# Patient Record
Sex: Female | Born: 1995 | Race: Black or African American | Hispanic: No | Marital: Single | Smoking: Never smoker
Health system: Southern US, Community
[De-identification: ages and names within clinical notes are randomized; demographics above are authoritative.]

## PROBLEM LIST (undated history)

## (undated) DIAGNOSIS — E119 Type 2 diabetes mellitus without complications: Secondary | ICD-10-CM

---
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
## 2013-06-03 ENCOUNTER — Emergency Department: Payer: Self-pay | Admitting: Emergency Medicine

## 2013-07-26 ENCOUNTER — Emergency Department: Payer: Self-pay | Admitting: Emergency Medicine

## 2013-07-26 LAB — URINALYSIS, COMPLETE
Bilirubin,UR: NEGATIVE
Blood: NEGATIVE
Glucose,UR: NEGATIVE mg/dL (ref 0–75)
Ketone: NEGATIVE
Nitrite: NEGATIVE
Ph: 5 (ref 4.5–8.0)
Protein: NEGATIVE
RBC,UR: 2 /HPF (ref 0–5)
SPECIFIC GRAVITY: 1.027 (ref 1.003–1.030)
Squamous Epithelial: 50
WBC UR: 5 /HPF (ref 0–5)

## 2013-07-26 LAB — CBC WITH DIFFERENTIAL/PLATELET
BASOS ABS: 0.1 10*3/uL (ref 0.0–0.1)
BASOS PCT: 1 %
EOS ABS: 0.2 10*3/uL (ref 0.0–0.7)
Eosinophil %: 3.1 %
HCT: 32 % — ABNORMAL LOW (ref 35.0–47.0)
HGB: 10.9 g/dL — ABNORMAL LOW (ref 12.0–16.0)
LYMPHS PCT: 21.7 %
Lymphocyte #: 1.2 10*3/uL (ref 1.0–3.6)
MCH: 28.8 pg (ref 26.0–34.0)
MCHC: 34 g/dL (ref 32.0–36.0)
MCV: 85 fL (ref 80–100)
MONO ABS: 0.6 x10 3/mm (ref 0.2–0.9)
Monocyte %: 10 %
NEUTROS ABS: 3.7 10*3/uL (ref 1.4–6.5)
NEUTROS PCT: 64.2 %
Platelet: 186 10*3/uL (ref 150–440)
RBC: 3.77 10*6/uL — ABNORMAL LOW (ref 3.80–5.20)
RDW: 16.9 % — ABNORMAL HIGH (ref 11.5–14.5)
WBC: 5.7 10*3/uL (ref 3.6–11.0)

## 2013-07-26 LAB — GC/CHLAMYDIA PROBE AMP

## 2013-07-26 LAB — COMPREHENSIVE METABOLIC PANEL
ALK PHOS: 101 U/L
Albumin: 3.3 g/dL — ABNORMAL LOW (ref 3.8–5.6)
Anion Gap: 6 — ABNORMAL LOW (ref 7–16)
BILIRUBIN TOTAL: 0.4 mg/dL (ref 0.2–1.0)
BUN: 11 mg/dL (ref 7–18)
CHLORIDE: 110 mmol/L — AB (ref 98–107)
CREATININE: 0.62 mg/dL (ref 0.60–1.30)
Calcium, Total: 8.1 mg/dL — ABNORMAL LOW (ref 9.0–10.7)
Co2: 21 mmol/L (ref 21–32)
EGFR (African American): >60
EGFR (Non-African Amer.): >60
GLUCOSE: 114 mg/dL — AB (ref 65–99)
Osmolality: 274 (ref 275–301)
POTASSIUM: 3.8 mmol/L (ref 3.5–5.1)
SGOT(AST): 18 U/L (ref 0–26)
SGPT (ALT): 11 U/L — ABNORMAL LOW (ref 12–78)
SODIUM: 137 mmol/L (ref 136–145)
Total Protein: 6.5 g/dL (ref 6.4–8.6)

## 2013-07-26 LAB — WET PREP, GENITAL

## 2013-07-26 LAB — LIPASE, BLOOD: LIPASE: 123 U/L (ref 73–393)

## 2013-07-26 LAB — HCG, QUANTITATIVE, PREGNANCY: Beta Hcg, Quant.: 40136 m[IU]/mL — ABNORMAL HIGH

## 2014-07-14 ENCOUNTER — Emergency Department: Payer: Self-pay | Admitting: Emergency Medicine

## 2018-02-24 ENCOUNTER — Encounter: Payer: Self-pay | Admitting: Emergency Medicine

## 2018-02-24 ENCOUNTER — Emergency Department
Admission: EM | Admit: 2018-02-24 | Discharge: 2018-02-24 | Disposition: A | Discharge status: Home / Self Care | Payer: Self-pay | Attending: Emergency Medicine | Admitting: Emergency Medicine

## 2018-02-24 DIAGNOSIS — Z5321 Procedure and treatment not carried out due to patient leaving prior to being seen by health care provider: Secondary | ICD-10-CM | POA: Insufficient documentation

## 2018-02-24 DIAGNOSIS — O3461 Maternal care for abnormality of vagina, first trimester: Secondary | ICD-10-CM | POA: Insufficient documentation

## 2018-02-24 DIAGNOSIS — Z3A01 Less than 8 weeks gestation of pregnancy: Secondary | ICD-10-CM | POA: Insufficient documentation

## 2018-02-24 LAB — URINALYSIS, ROUTINE W REFLEX MICROSCOPIC
Bacteria, UA: NONE SEEN
Bilirubin Urine: NEGATIVE
HGB URINE DIPSTICK: NEGATIVE
Ketones, ur: NEGATIVE mg/dL
NITRITE: NEGATIVE
PH: 6 (ref 5.0–8.0)
PROTEIN: NEGATIVE mg/dL
RBC / HPF: >50 RBC/hpf — ABNORMAL HIGH (ref 0–5)
SPECIFIC GRAVITY, URINE: 1.029 (ref 1.005–1.030)

## 2018-02-24 LAB — HCG, QUANTITATIVE, PREGNANCY: hCG, Beta Chain, Quant, S: 14938 m[IU]/mL — ABNORMAL HIGH (ref <5)

## 2018-02-24 NOTE — ED Notes (Signed)
Pt called x 2, no answer. RN walked around lobby and did not see pt

## 2018-02-24 NOTE — ED Notes (Signed)
Pt called,no answer.

## 2018-02-24 NOTE — ED Triage Notes (Signed)
Pt presents via POV c/o green and brown vaginal discharge. Pt reports + pregnancy test x1 week ago and reports apx 7 weeks OB. Denies pain.

## 2018-02-26 ENCOUNTER — Telehealth: Payer: Self-pay | Admitting: Emergency Medicine

## 2018-02-26 NOTE — Telephone Encounter (Signed)
Called patient due to lwot to inquire about condition and follow up plans.  No answer and no voicemail  

## 2018-04-17 ENCOUNTER — Emergency Department: Payer: Self-pay

## 2018-04-17 ENCOUNTER — Encounter: Payer: Self-pay | Admitting: Emergency Medicine

## 2018-04-17 ENCOUNTER — Emergency Department
Admission: EM | Admit: 2018-04-17 | Discharge: 2018-04-17 | Disposition: A | Discharge status: Home / Self Care | Payer: Self-pay | Attending: Emergency Medicine | Admitting: Emergency Medicine

## 2018-04-17 DIAGNOSIS — S60221A Contusion of right hand, initial encounter: Secondary | ICD-10-CM

## 2018-04-17 DIAGNOSIS — Y929 Unspecified place or not applicable: Secondary | ICD-10-CM | POA: Insufficient documentation

## 2018-04-17 DIAGNOSIS — W2209XA Striking against other stationary object, initial encounter: Secondary | ICD-10-CM | POA: Insufficient documentation

## 2018-04-17 DIAGNOSIS — Y999 Unspecified external cause status: Secondary | ICD-10-CM | POA: Insufficient documentation

## 2018-04-17 DIAGNOSIS — Y939 Activity, unspecified: Secondary | ICD-10-CM | POA: Insufficient documentation

## 2018-04-17 DIAGNOSIS — S6991XA Unspecified injury of right wrist, hand and finger(s), initial encounter: Secondary | ICD-10-CM | POA: Insufficient documentation

## 2018-04-17 LAB — GLUCOSE, CAPILLARY: GLUCOSE-CAPILLARY: 115 mg/dL — AB (ref 70–99)

## 2018-04-17 MED ORDER — MELOXICAM 15 MG PO TABS: 15.0000 mg | ORAL_TABLET | Freq: Every day | ORAL | 1 refills | Status: AC

## 2018-04-17 NOTE — ED Triage Notes (Signed)
Pt reports she slammed right hand down onto wooden surface this evening. Pt c/o right hand pain. No swelling or deformity noted.

## 2018-04-17 NOTE — ED Notes (Signed)
R radial pulse 2+; warm; appropriate color; pt states she can't move her right hand at all.

## 2018-04-17 NOTE — ED Provider Notes (Signed)
[facility] Emergency Department Provider Note  ____________________________________________  Time seen: Approximately 11:56 PM  I have reviewed the triage vital signs and the nursing notes.   HISTORY  Chief Complaint Hand Pain    HPI Gwyndolyn KaufmanQuanisha X Hawkins Mack is a 22 y.o. female presents to the emergency department with right hand pain after patient reports that she slammed her right hand onto a wooden surface earlier in the evening.  Patient currently rates her pain at 8 out of 10 in intensity and describes it as aching.  No numbness or tingling in the right hand.  No abrasions or lacerations.  Patient denies prior right hand issues in the past.  No alleviating measures have been attempted prior to presenting to the emergency department.    History reviewed. No pertinent past medical history.  There are no active problems to display for this patient.   History reviewed. No pertinent surgical history.  Prior to Admission medications   Medication Sig Start Date End Date Taking? Authorizing Provider  meloxicam (MOBIC) 15 MG tablet Take 1 tablet (15 mg total) by mouth daily for 7 days. 04/17/18 04/24/18  Orvil FeilWoods, Johnpaul Gillentine M, PA-C    Allergies Amoxicillin  History reviewed. No pertinent family history.  Social History Social History   Tobacco Use  . Smoking status: Never Smoker  . Smokeless tobacco: Never Used  Substance Use Topics  . Alcohol use: Not on file  . Drug use: Yes    Types: Marijuana     Review of Systems  Constitutional: No fever/chills Eyes: No visual changes. No discharge ENT: No upper respiratory complaints. Cardiovascular: no chest pain. Respiratory: no cough. No SOB. Gastrointestinal: No abdominal pain.  No nausea, no vomiting.  No diarrhea.  No constipation. Musculoskeletal: Patient has right hand pain.  Skin: Negative for rash, abrasions, lacerations, ecchymosis. Neurological: Negative for headaches, focal weakness or  numbness.   ____________________________________________   PHYSICAL EXAM:  VITAL SIGNS: ED Triage Vitals  Enc Vitals Group     BP 04/17/18 2156 118/68     Pulse Rate 04/17/18 2156 92     Resp 04/17/18 2156 16     Temp 04/17/18 2156 97.8 F (36.6 C)     Temp Source 04/17/18 2156 Oral     SpO2 04/17/18 2156 100 %     Weight --      Height --      Head Circumference --      Peak Flow --      Pain Score 04/17/18 2312 7     Pain Loc --      Pain Edu? --      Excl. in GC? --      Constitutional: Alert and oriented. Well appearing and in no acute distress. Eyes: Conjunctivae are normal. PERRL. EOMI. Head: Atraumatic. Cardiovascular: Normal rate, regular rhythm. Normal S1 and S2.  Good peripheral circulation. Respiratory: Normal respiratory effort without tachypnea or retractions. Lungs CTAB. Good air entry to the bases with no decreased or absent breath sounds. Musculoskeletal: No right hand swelling.  Patient is able to move all 5 right fingers.  Mild tenderness to palpation over the fourth and fifth metacarpals.  No pain with palpation of the anatomical snuffbox.  Full range of motion at the right wrist and right elbow.  Palpable radial pulse, right. Neurologic:  Normal speech and language. No gross focal neurologic deficits are appreciated.  Skin:  Skin is warm, dry and intact. No rash noted. Psychiatric: Mood and affect are  normal. Speech and behavior are normal. Patient exhibits appropriate insight and judgement.   ____________________________________________   LABS (all labs ordered are listed, but only abnormal results are displayed)  Labs Reviewed  GLUCOSE, CAPILLARY - Abnormal; Notable for the following components:      Result Value   Glucose-Capillary 115 (*)    All other components within normal limits   ____________________________________________  EKG   ____________________________________________  RADIOLOGY I personally viewed and evaluated these  images as part of my medical decision making, as well as reviewing the written report by the radiologist.  Dg Hand Complete Right  Result Date: 04/17/2018 CLINICAL DATA:  Right hand pain after injury, slammed hand down onto wooden surface this evening. EXAM: RIGHT HAND - COMPLETE 3+ VIEW COMPARISON:  None. FINDINGS: Evaluation of the digits partially obscured due to positioning, patient reported being unable to separate her fingers. There is no evidence of fracture or dislocation. There is no evidence of arthropathy or other focal bone abnormality. Soft tissues are unremarkable. IMPRESSION: Negative radiographs of the right hand allowing for limited assessment of the digits secondary to positioning. Electronically Signed   By: Narda Rutherford M.D.   On: 04/17/2018 22:20    ____________________________________________    PROCEDURES  Procedure(s) performed:    Procedures    Medications - No data to display   ____________________________________________   INITIAL IMPRESSION / ASSESSMENT AND PLAN / ED COURSE  Pertinent labs & imaging results that were available during my care of the patient were reviewed by me and considered in my medical decision making (see chart for details).  Review of the [place] CSRS was performed in accordance of the [facility] prior to dispensing any controlled drugs.      Assessment and plan Right hand pain Patient presents to the emergency department with right hand pain after patient reportedly slammed her hand on a wooden table.  X-ray examination reveals no acute bony abnormality.  Patient had some mild tenderness to palpation over the fourth and fifth metacarpals of the right hand. Physical exam was otherwise reassuring.  Meloxicam was recommended for pain after patient denied the possibility of pregnancy.  Vital signs were reassuring prior to discharge.    ____________________________________________  FINAL CLINICAL IMPRESSION(S) / ED DIAGNOSES  Final  diagnoses:  Contusion of right hand, initial encounter      NEW MEDICATIONS STARTED DURING THIS VISIT:  ED Discharge Orders         Ordered    meloxicam (MOBIC) 15 MG tablet  Daily     04/17/18 2301              This chart was dictated using voice recognition software/Dragon. Despite best efforts to proofread, errors can occur which can change the meaning. Any change was purely unintentional.    Orvil Feil, PA-C 04/17/18 2359    Minna Antis, MD 04/20/18 [phone]

## 2019-08-06 IMAGING — DX DG HAND COMPLETE 3+V*R*
3 series · 3 of 3 positions shown · non-contrast
Comparison: None.

CLINICAL DATA: Right hand pain after injury, slammed hand down onto
[place] surface this evening.

EXAM:
RIGHT HAND - COMPLETE 3+ VIEW

[hand ap]
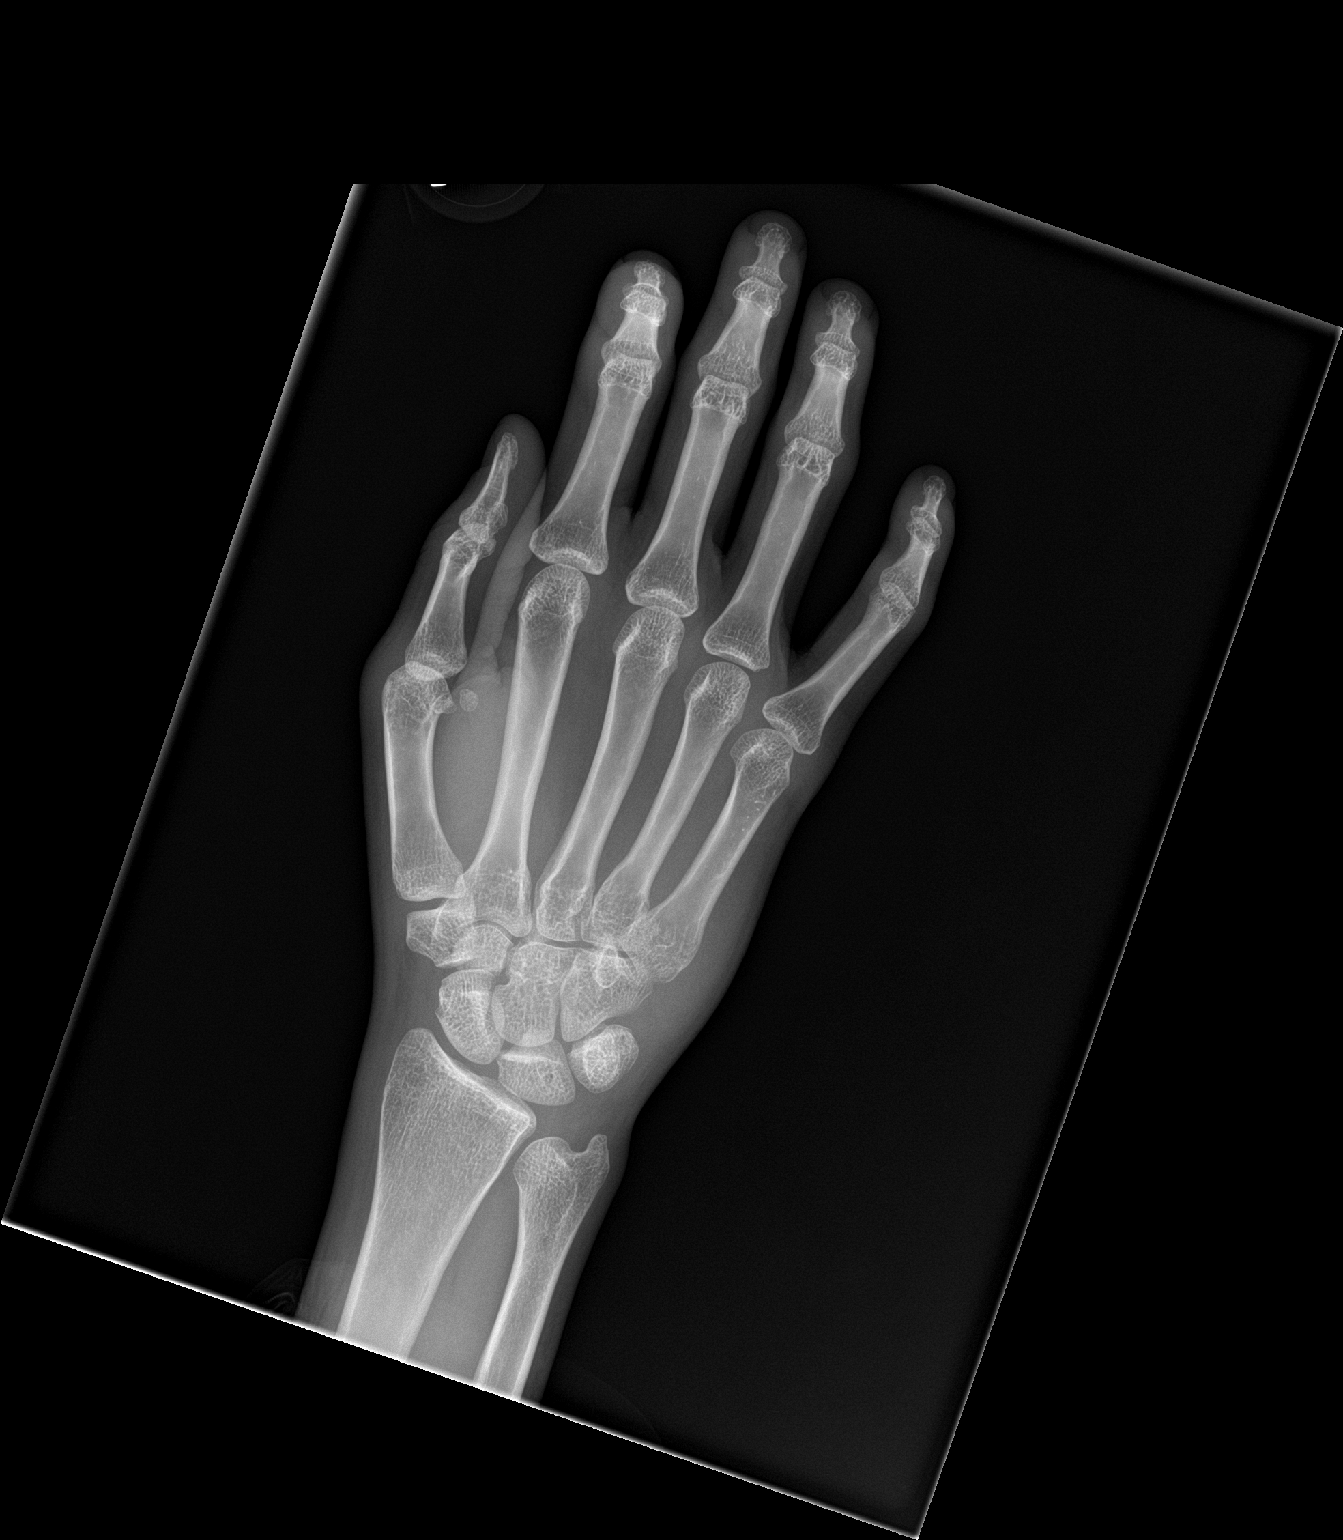

[hand obl]
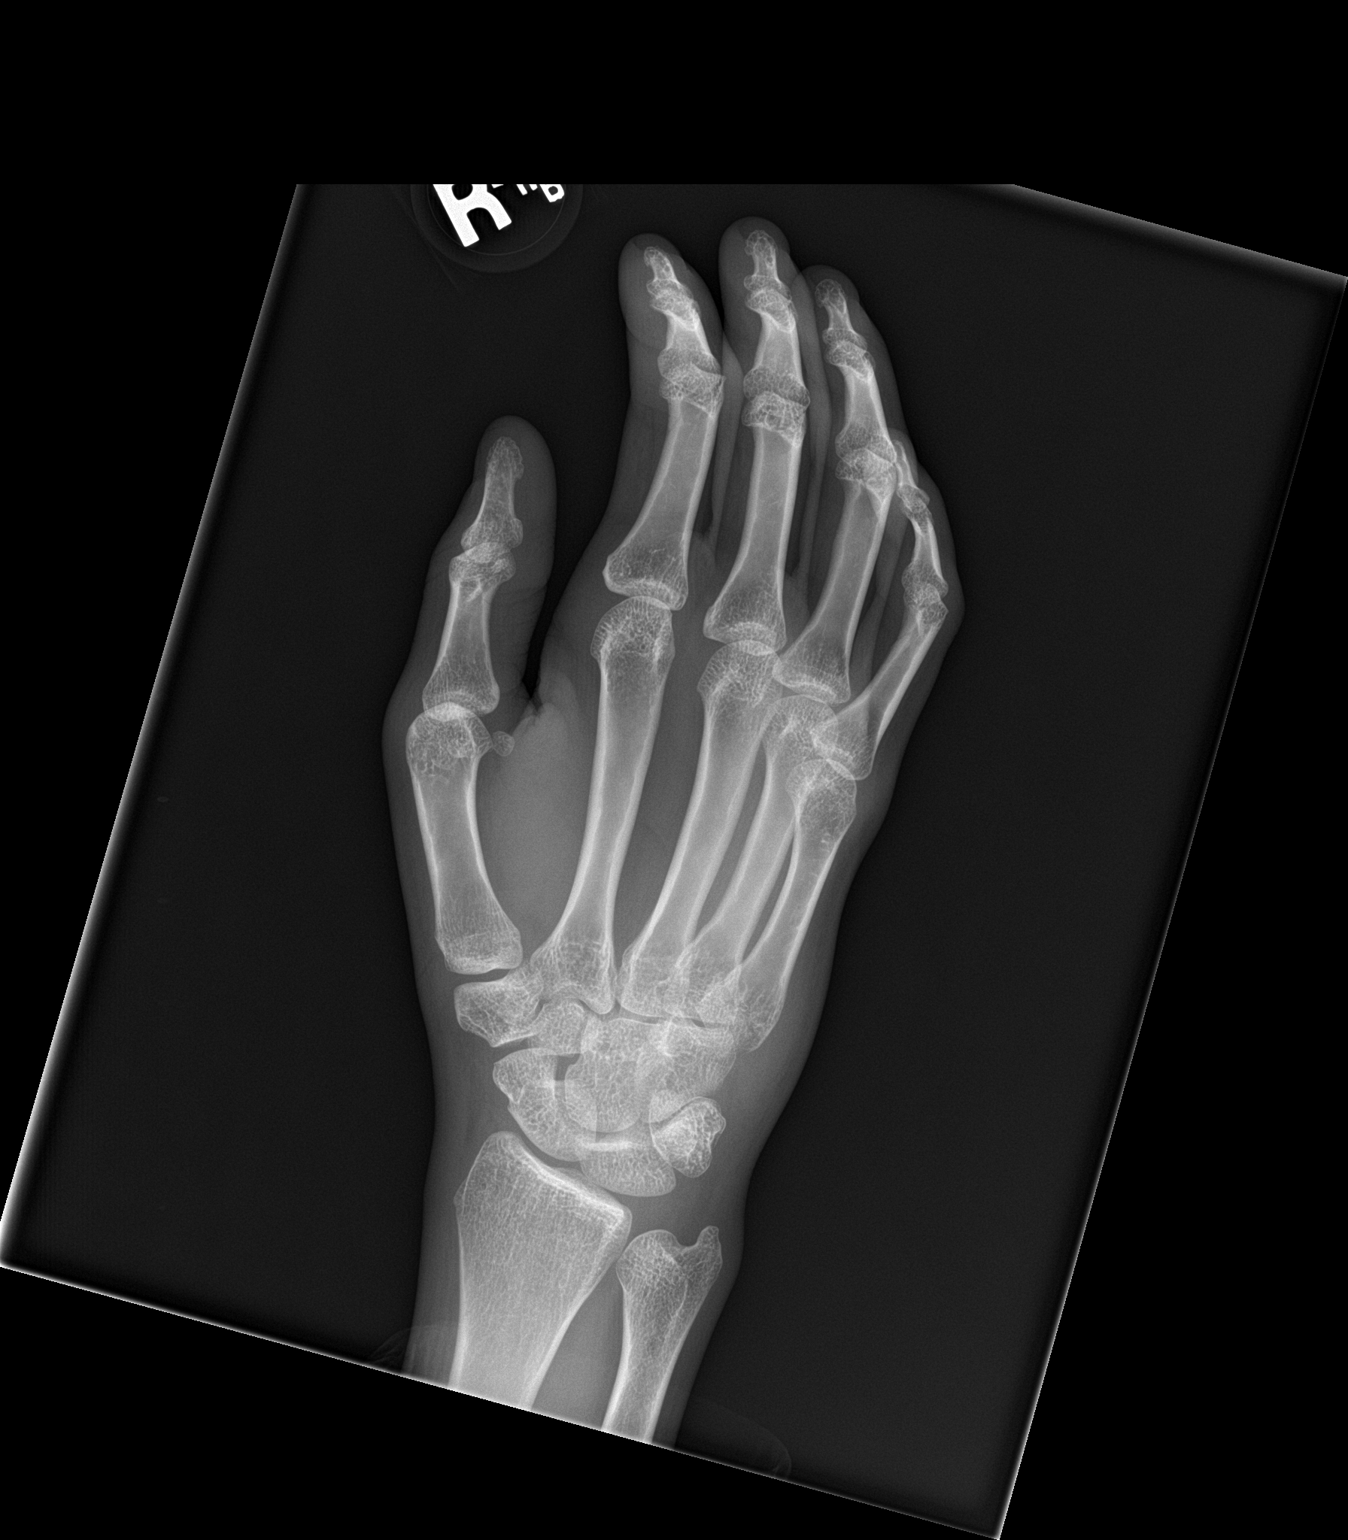

[hand lat]
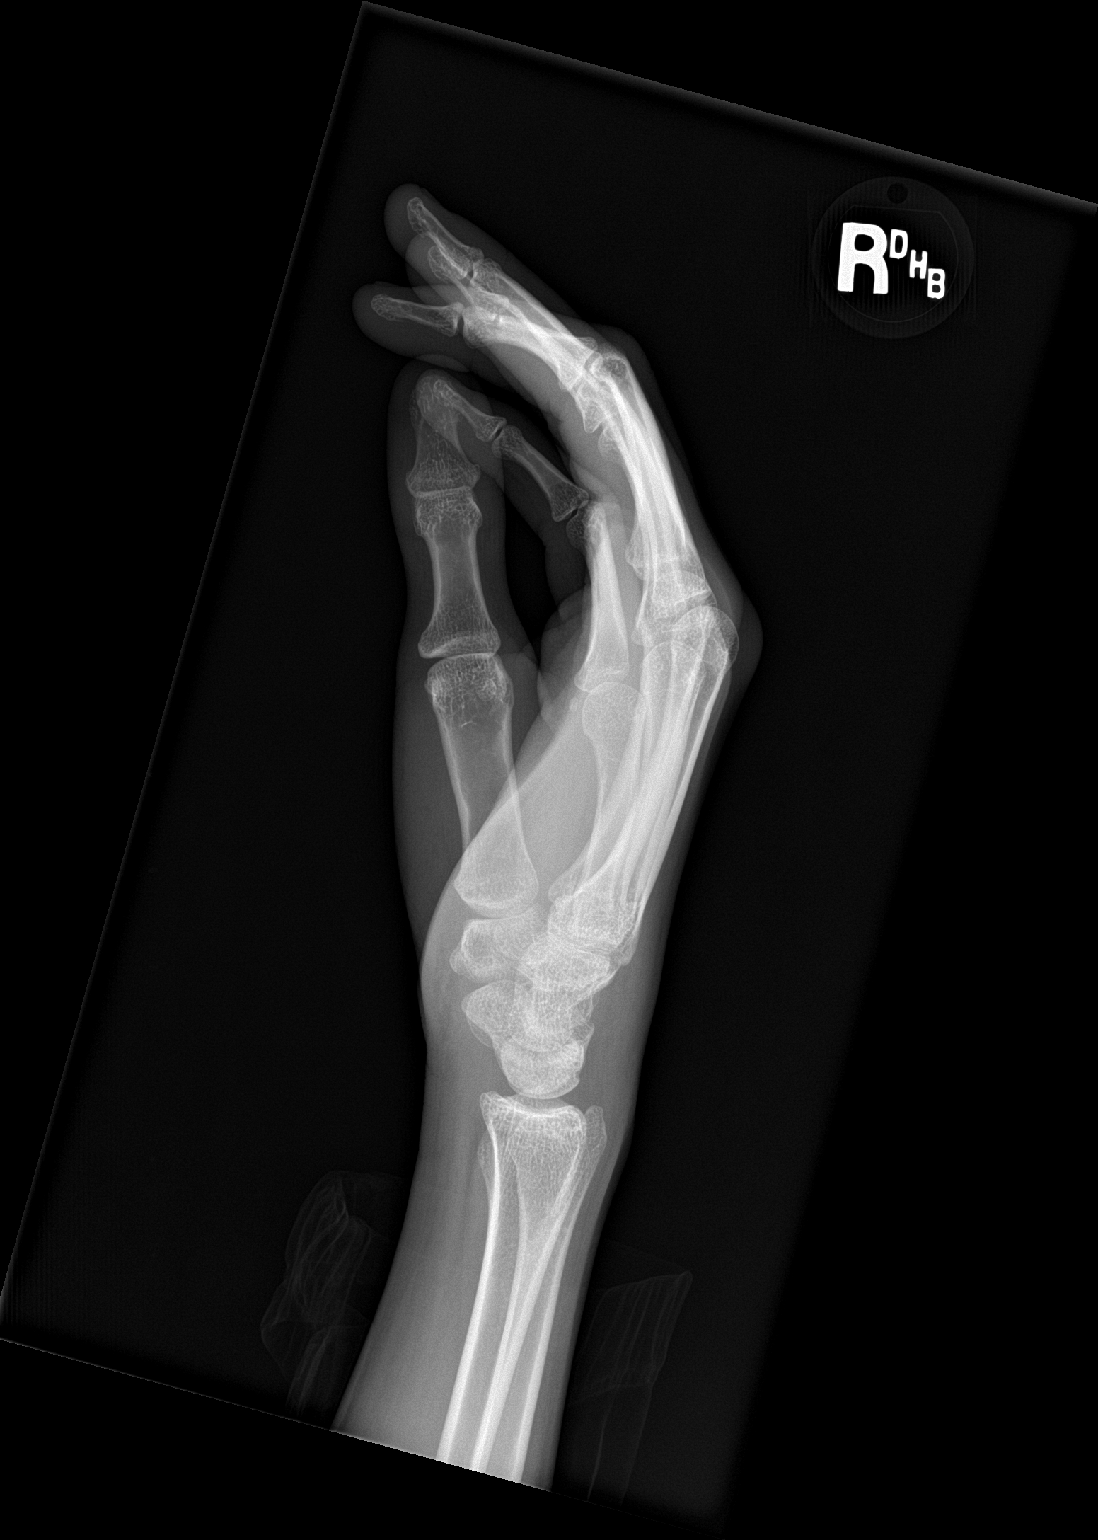

[3 of 3 positions shown; findings below may reference images not displayed]

FINDINGS: Evaluation of the digits partially obscured due to positioning,
patient reported being unable to separate her fingers. There is no
evidence of fracture or dislocation. There is no evidence of
arthropathy or other focal bone abnormality. Soft tissues are
unremarkable.
IMPRESSION: Negative radiographs of the right hand allowing for limited
assessment of the digits secondary to positioning.

## 2020-06-28 ENCOUNTER — Emergency Department
Admission: EM | Admit: 2020-06-28 | Discharge: 2020-06-28 | Disposition: A | Discharge status: Home / Self Care | Payer: Self-pay | Attending: Emergency Medicine | Admitting: Emergency Medicine

## 2020-06-28 ENCOUNTER — Other Ambulatory Visit: Payer: Self-pay

## 2020-06-28 DIAGNOSIS — Z794 Long term (current) use of insulin: Secondary | ICD-10-CM | POA: Insufficient documentation

## 2020-06-28 DIAGNOSIS — E109 Type 1 diabetes mellitus without complications: Secondary | ICD-10-CM | POA: Insufficient documentation

## 2020-06-28 DIAGNOSIS — L03011 Cellulitis of right finger: Secondary | ICD-10-CM | POA: Insufficient documentation

## 2020-06-28 HISTORY — DX: Type 2 diabetes mellitus without complications: E11.9

## 2020-06-28 MED ORDER — LIDOCAINE HCL (PF) 1 % IJ SOLN
10.0000 mL | Freq: Once | INTRAMUSCULAR | Status: AC
  Administered 2020-06-28: 10 mL via INTRADERMAL
  Filled 2020-06-28: qty 10

## 2020-06-28 MED ORDER — SULFAMETHOXAZOLE-TRIMETHOPRIM 800-160 MG PO TABS
1.0000 | ORAL_TABLET | Freq: Once | ORAL | Status: AC
  Administered 2020-06-28: 1 via ORAL
  Filled 2020-06-28: qty 1

## 2020-06-28 MED ORDER — CEPHALEXIN 500 MG PO CAPS
500.0000 mg | ORAL_CAPSULE | Freq: Once | ORAL | Status: AC
  Administered 2020-06-28: 500 mg via ORAL
  Filled 2020-06-28: qty 1

## 2020-06-28 MED ORDER — CEPHALEXIN 500 MG PO CAPS: 500.0000 mg | ORAL_CAPSULE | Freq: Four times a day (QID) | ORAL | 0 refills | Status: AC

## 2020-06-28 MED ORDER — SULFAMETHOXAZOLE-TRIMETHOPRIM 800-160 MG PO TABS: 1.0000 | ORAL_TABLET | Freq: Two times a day (BID) | ORAL | 0 refills | Status: AC

## 2020-06-28 MED ORDER — SULFAMETHOXAZOLE-TRIMETHOPRIM 800-160 MG PO TABS: 1.0000 | ORAL_TABLET | Freq: Once | ORAL | Status: DC

## 2020-06-28 NOTE — ED Triage Notes (Signed)
Pt c/o swelling to the right thumb cuticle for the past 2-3 days

## 2020-06-28 NOTE — ED Notes (Signed)
See triage note  Presents with pain and swelling to right thumb

## 2020-06-28 NOTE — ED Provider Notes (Signed)
[facility] Emergency Department Provider Note  ____________________________________________   Event Date/Time   First MD Initiated Contact with Patient 06/28/20 1356     (approximate)  I have reviewed the triage vital signs and the nursing notes.   HISTORY  Chief Complaint Hand Pain  HPI [name] is a 25 y.o. female reports to the emergency department for evaluation of swelling to the right thumb and nail cuticle for the last 2 to 3 days.  Patient states that she bites her nails and is also a type I diabetic.  She states that she has had history of similar in the past from biting her nails.  She reports that she accidentally knocked it against something 1 to 2 days ago and it drained white fluid.  She endorses pain to the region but has not tried any alleviating measures at this time.  Denies fevers or other systemic symptoms.         Past Medical History:  Diagnosis Date  . Diabetes mellitus without complication (HCC)     There are no problems to display for this patient.   History reviewed. No pertinent surgical history.  Prior to Admission medications   Medication Sig Start Date End Date Taking? Authorizing Provider  cephALEXin (KEFLEX) 500 MG capsule Take 1 capsule (500 mg total) by mouth 4 (four) times daily for 10 days. 06/28/20 07/08/20 Yes Silvia Hightower, Ruben Gottron, PA  sulfamethoxazole-trimethoprim (BACTRIM DS) 800-160 MG tablet Take 1 tablet by mouth 2 (two) times daily for 10 days. 06/28/20 07/08/20 Yes Lucy Chris, PA    Allergies Amoxicillin  No family history on file.  Social History Social History   Tobacco Use  . Smoking status: Never Smoker  . Smokeless tobacco: Never Used  Substance Use Topics  . Alcohol use: Yes  . Drug use: Yes    Types: Marijuana    Review of Systems Constitutional: No fever/chills Eyes: No visual changes. ENT: No sore throat. Cardiovascular: Denies chest pain. Respiratory: Denies  shortness of breath. Gastrointestinal: No abdominal pain.  No nausea, no vomiting.  No diarrhea.  No constipation. Genitourinary: Negative for dysuria. Musculoskeletal: + Right thumb pain, negative for back pain. Skin: Negative for rash. Neurological: Negative for headaches, focal weakness or numbness.  ____________________________________________   PHYSICAL EXAM:  VITAL SIGNS: ED Triage Vitals  Enc Vitals Group     BP 06/28/20 1149 (!) 122/92     Pulse Rate 06/28/20 1149 79     Resp 06/28/20 1149 18     Temp 06/28/20 1149 98.3 F (36.8 C)     Temp Source 06/28/20 1149 Oral     SpO2 06/28/20 1149 100 %     Weight 06/28/20 1154 140 lb (63.5 kg)     Height 06/28/20 1154 5\' 7"  (1.702 m)     Head Circumference --      Peak Flow --      Pain Score 06/28/20 1154 9     Pain Loc --      Pain Edu? --      Excl. in GC? --     Constitutional: Alert and oriented. Well appearing and in no acute distress. Eyes: Conjunctivae are normal. PERRL. EOMI. Head: Atraumatic. Nose: No congestion/rhinnorhea. Mouth/Throat: Mucous membranes are moist.  Neck: No stridor.   Cardiovascular: Normal rate, regular rhythm. Grossly normal heart sounds.  Good peripheral circulation. Respiratory: Normal respiratory effort.  No retractions. Lungs CTAB. Gastrointestinal: Soft and nontender. No distention. No abdominal bruits.  No CVA tenderness. Musculoskeletal: Full range of motion of the wrist and digits of the right hand.  Radial pulse 2+.  Capillary refill less than 3 seconds.  There is some soft tissue swelling noted at the base of the nail on the right thumb.  There is induration noted to the pad of the thumb as well.  Minimal erythema. Neurologic:  Normal speech and language. No gross focal neurologic deficits are appreciated. No gait instability. Skin: See description of right thumb above. Psychiatric: Mood and affect are normal. Speech and behavior are  normal.  ____________________________________________   PROCEDURES  Procedure(s) performed (including Critical Care):  Marland KitchenMarland KitchenIncision and Drainage  Date/Time: 06/28/2020 6:18 PM Performed by: Lucy Chris, PA Authorized by: Lucy Chris, PA   Consent:    Consent obtained:  Verbal   Consent given by:  Patient   Risks, benefits, and alternatives were discussed: yes     Risks discussed:  Bleeding, incomplete drainage and infection   Alternatives discussed:  No treatment, delayed treatment and referral Universal protocol:    Procedure explained and questions answered to patient or proxy's satisfaction: yes     Patient identity confirmed:  Verbally with patient Location:    Indications for incision and drainage: paronychia with early felon.   Location:  Upper extremity   Upper extremity location:  Finger   Finger location:  R thumb Pre-procedure details:    Skin preparation:  Povidone-iodine Sedation:    Sedation type:  None Anesthesia:    Anesthesia method:  Nerve block   Block needle gauge:  25 G   Block anesthetic:  Lidocaine 1% w/o epi   Block technique:  Digital block   Block injection procedure:  Introduced needle, negative aspiration for blood, anatomic landmarks palpated and incremental injection   Block outcome:  Anesthesia achieved Procedure type:    Complexity:  Simple Procedure details:    Incision types:  Stab incision   Drainage:  Purulent and bloody   Drainage amount:  Moderate   Wound treatment:  Wound left open   Packing materials:  None Post-procedure details:    Procedure completion:  Tolerated well, no immediate complications     ____________________________________________   INITIAL IMPRESSION / ASSESSMENT AND PLAN / ED COURSE  As part of my medical decision making, I reviewed the following data within the electronic MEDICAL RECORD NUMBER Nursing notes reviewed and incorporated and Notes from prior ED visits        Patient is a  25 year old female who reports to the emergency department for evaluation of suspected infection to her right thumb.  See HPI for further details.  On physical exam, the patient does have some paronychia noted to the base of the right thumb, however there is induration with suspected associated felon.  Risks and benefits were discussed with drainage, the patient is amenable with this.  The area was prepped in a moderate amount of purulent drainage was obtained.  Given the patient is a type I diabetic, will double cover her with Keflex and Bactrim.  She does have an amoxicillin allergy but states that she has tolerated Keflex in the past.  Patient will follow up with her primary care or return to the emergency department with any acute worsening.      ____________________________________________   FINAL CLINICAL IMPRESSION(S) / ED DIAGNOSES  Final diagnoses:  Felon of finger of right hand     ED Discharge Orders         Ordered  sulfamethoxazole-trimethoprim (BACTRIM DS) 800-160 MG tablet  2 times daily        06/28/20 1524    cephALEXin (KEFLEX) 500 MG capsule  4 times daily        06/28/20 1524          *Please note:  Rebecca Callahan was evaluated in Emergency Department on 06/28/2020 for the symptoms described in the history of present illness. She was evaluated in the context of the global COVID-19 pandemic, which necessitated consideration that the patient might be at risk for infection with the SARS-CoV-2 virus that causes COVID-19. Institutional protocols and algorithms that pertain to the evaluation of patients at risk for COVID-19 are in a state of rapid change based on information released by regulatory bodies including the CDC and federal and state organizations. These policies and algorithms were followed during the patient's care in the ED.  Some ED evaluations and interventions may be delayed as a result of limited staffing during and the pandemic.*   Note:  This  document was prepared using Dragon voice recognition software and may include unintentional dictation errors.   Lucy Chris, PA 06/28/20 1821    Sharman Cheek, MD 06/29/20 [phone]

## 2021-06-22 ENCOUNTER — Emergency Department
Admission: EM | Admit: 2021-06-22 | Discharge: 2021-06-22 | Disposition: A | Discharge status: Home / Self Care | Payer: Self-pay | Attending: Emergency Medicine | Admitting: Emergency Medicine

## 2021-06-22 ENCOUNTER — Other Ambulatory Visit: Payer: Self-pay

## 2021-06-22 ENCOUNTER — Encounter: Payer: Self-pay | Admitting: Emergency Medicine

## 2021-06-22 DIAGNOSIS — L03012 Cellulitis of left finger: Secondary | ICD-10-CM | POA: Insufficient documentation

## 2021-06-22 MED ORDER — CEPHALEXIN 500 MG PO CAPS: 500.0000 mg | ORAL_CAPSULE | Freq: Three times a day (TID) | ORAL | 0 refills | Status: AC

## 2021-06-22 MED ORDER — CEPHALEXIN 500 MG PO CAPS
500.0000 mg | ORAL_CAPSULE | Freq: Once | ORAL | Status: AC
  Administered 2021-06-22: 500 mg via ORAL
  Filled 2021-06-22: qty 1

## 2021-06-22 MED ORDER — LIDOCAINE HCL (PF) 1 % IJ SOLN
5.0000 mL | Freq: Once | INTRAMUSCULAR | Status: AC
  Administered 2021-06-22: 5 mL
  Filled 2021-06-22: qty 5

## 2021-06-22 NOTE — ED Provider Notes (Signed)
[facility] Provider Note  Patient Contact: 9:50 AM (approximate)   History   Hand Pain   HPI  Rebecca Callahan is a 26 y.o. female patient to the ED with complaints of swelling to the cuticle of the left fourth finger.  She notes some purulent drainage.   Physical Exam   Triage Vital Signs: ED Triage Vitals  Enc Vitals Group     BP 06/22/21 0852 132/90     Pulse Rate 06/22/21 0852 78     Resp 06/22/21 0852 17     Temp 06/22/21 0852 98.8 F (37.1 C)     Temp Source 06/22/21 0852 Oral     SpO2 06/22/21 0852 100 %     Weight 06/22/21 0850 138 lb 14.2 oz (63 kg)     Height 06/22/21 0850 5\' 7"  (1.702 m)     Head Circumference --      Peak Flow --      Pain Score 06/22/21 0850 10     Pain Loc --      Pain Edu? --      Excl. in GC? --     Most recent vital signs: Vitals:   06/22/21 0852  BP: 132/90  Pulse: 78  Resp: 17  Temp: 98.8 F (37.1 C)  SpO2: 100%     General: Alert and in no acute distress. Cardiovascular:  Good peripheral perfusion Respiratory: Normal respiratory effort without tachypnea or retractions. Lungs CTAB.  Musculoskeletal: Full range of motion to all extremities.  Normal composite fist on the left.  Patient with a focal small pus collection to the lateral cuticle Neurologic:  No gross focal neurologic deficits are appreciated.  Skin:   No rash noted Other:   ED Results / Procedures / Treatments   Labs (all labs ordered are listed, but only abnormal results are displayed) Labs Reviewed - No data to display   EKG   RADIOLOGY  No results found.  PROCEDURES:  Critical Care performed: No  ..Incision and Drainage  Date/Time: 06/22/2021 10:03 AM Performed by: 08/20/2021, PA-C Authorized by: Lissa Hoard, PA-C   Consent:    Consent obtained:  Verbal   Consent given by:  Patient   Risks discussed:  Pain and incomplete drainage   Alternatives discussed:  Alternative  treatment Universal protocol:    Site/side marked: yes     Patient identity confirmed:  Verbally with patient Location:    Indications for incision and drainage: paronychia.   Size:  1   Location:  Upper extremity   Upper extremity location:  Finger   Finger location:  L ring finger Pre-procedure details:    Skin preparation:  Povidone-iodine Sedation:    Sedation type:  None Anesthesia:    Anesthesia method: transthecal block. Procedure type:    Complexity:  Simple Procedure details:    Ultrasound guidance: no     Needle aspiration: no     Incision types:  Single straight   Incision depth:  Dermal   Drainage:  Bloody   Drainage amount:  Scant   Wound treatment:  Wound left open   Packing materials:  None Post-procedure details:    Procedure completion:  Tolerated well, no immediate complications   MEDICATIONS ORDERED IN ED: Medications  lidocaine (PF) (XYLOCAINE) 1 % injection 5 mL (5 mLs Infiltration Given by Other 06/22/21 1013)  cephALEXin (KEFLEX) capsule 500 mg (500 mg Oral Given 06/22/21 1013)     IMPRESSION /  MDM / ASSESSMENT AND PLAN / ED COURSE  I reviewed the triage vital signs and the nursing notes.                              Differential diagnosis includes, but is not limited to, paronychia, felon, subungual hemorrhage, nail avulsion.   Patient's diagnosis is consistent with a paronychia.  Patient agrees to a local I&D procedure which needs just some mild blood drainage.  Dressing is placed and patient is discharged with wound care instructions and supplies.  Patient will be discharged home with prescriptions for Keflex. Patient is to follow up with her PCP as needed or otherwise directed. Patient is given ED precautions to return to the ED for any worsening or new symptoms.   FINAL CLINICAL IMPRESSION(S) / ED DIAGNOSES   Final diagnoses:  Paronychia of finger, left     Rx / DC Orders   ED Discharge Orders          Ordered    cephALEXin (KEFLEX)  500 MG capsule  3 times daily        06/22/21 1025             Note:  This document was prepared using Dragon voice recognition software and may include unintentional dictation errors.    Lissa Hoard, PA-C 06/22/21 1036    Sharman Cheek, MD 06/23/21 [phone]

## 2021-06-22 NOTE — ED Notes (Signed)
Pt has swelling around the nail of left ring finger.  States she thinks it started as a hang nail that just started swelling and became red. States she wanted it checked as she is a diabetic.

## 2021-06-22 NOTE — ED Triage Notes (Signed)
C/O left fourth finger pain, swelling to nail area.  States had drained some purulent drainage.

## 2021-06-22 NOTE — Discharge Instructions (Addendum)
Keep the finger clean, dry, and covered. Use warm water soaks to promote healing. Take the antibiotic as directed.

## 2021-06-23 ENCOUNTER — Other Ambulatory Visit: Payer: Self-pay

## 2021-06-23 ENCOUNTER — Encounter: Payer: Self-pay | Admitting: Emergency Medicine

## 2021-06-23 ENCOUNTER — Emergency Department
Admission: EM | Admit: 2021-06-23 | Discharge: 2021-06-23 | Disposition: A | Discharge status: Home / Self Care | Payer: Self-pay | Attending: Emergency Medicine | Admitting: Emergency Medicine

## 2021-06-23 DIAGNOSIS — Z5189 Encounter for other specified aftercare: Secondary | ICD-10-CM

## 2021-06-23 DIAGNOSIS — Z4800 Encounter for change or removal of nonsurgical wound dressing: Secondary | ICD-10-CM | POA: Insufficient documentation

## 2021-06-23 NOTE — ED Triage Notes (Signed)
Pt comes into the ED via POV c/o left ring finger infection.  PT was seen here yesterday where it was lanced open, but she states it feels worse today.  Minimal redness noted to the area.

## 2021-06-23 NOTE — ED Notes (Signed)
25 yof c/o finger pain to her left ring finger. The pt advised she was seen yesterday ref the same. The pt does have some swelling and what appears to infectious tissue close to the corner of the nail on left side. The pt rated her pain at a 10. The pt has a hx of dm and allergic to amoxicillin.

## 2021-06-23 NOTE — ED Provider Notes (Signed)
[facility] Provider Note  Patient Contact: 4:46 PM (approximate)   History   Wound Infection   HPI  Rebecca Callahan is a 26 y.o. female presents to the emergency department for left ring finger wound check.  Patient had incision and drainage for paronychia yesterday.  Patient states that area is still tender and "puffy".  No fever or chills.      Physical Exam   Triage Vital Signs: ED Triage Vitals  Enc Vitals Group     BP 06/23/21 1524 123/74     Pulse Rate 06/23/21 1524 90     Resp 06/23/21 1524 16     Temp 06/23/21 1524 98.9 F (37.2 C)     Temp Source 06/23/21 1524 Oral     SpO2 06/23/21 1524 100 %     Weight 06/23/21 1523 138 lb 14.2 oz (63 kg)     Height 06/23/21 1523 5\' 7"  (1.702 m)     Head Circumference --      Peak Flow --      Pain Score 06/23/21 1523 10     Pain Loc --      Pain Edu? --      Excl. in GC? --     Most recent vital signs: Vitals:   06/23/21 1524  BP: 123/74  Pulse: 90  Resp: 16  Temp: 98.9 F (37.2 C)  SpO2: 100%     General: Alert and in no acute distress. Cardiovascular:  Good peripheral perfusion Respiratory: Normal respiratory effort without tachypnea or retractions. Lungs CTAB. Good air entry to the bases with no decreased or absent breath sounds. Gastrointestinal: Bowel sounds 4 quadrants. Soft and nontender to palpation. No guarding or rigidity. No palpable masses. No distention. No CVA tenderness. Musculoskeletal: Full range of motion to all extremities.  Neurologic:  No gross focal neurologic deficits are appreciated.  Skin: Patient's left ring finger appears mildly edematous with no surrounding erythema or collection of purulence along the primary fingernail.   ED Results / Procedures / Treatments   Labs (all labs ordered are listed, but only abnormal results are displayed) Labs Reviewed - No data to display        MEDICATIONS ORDERED IN ED: Medications - No data to  display   IMPRESSION / MDM / ASSESSMENT AND PLAN / ED COURSE  I reviewed the triage vital signs and the nursing notes.                              Differential diagnosis includes, but is not limited to, wound check  Assessment and plan Wound check 26 year old female presents to the emergency department for wound check of the left ring finger.  Incision and drainage site is visualized and there is no collection of purulence along perimeter of fingernail.  Recommended warm soapy soaks 3 times daily for the next 7 days and compliance with Keflex as directed by provider yesterday.  Return precautions were given to return with new or worsening symptoms.  Tylenol and ibuprofen alternating were recommended for pain.      FINAL CLINICAL IMPRESSION(S) / ED DIAGNOSES   Final diagnoses:  Visit for wound check     Rx / DC Orders   ED Discharge Orders     None        Note:  This document was prepared using Dragon voice recognition software and may include unintentional dictation errors.  Pia Mau [place], PA-C 06/23/21 1648    Sharman Cheek, MD 06/23/21 [phone]

## 2021-06-23 NOTE — Discharge Instructions (Addendum)
Continue soaking the digit in warm soapy water. Continue taking Keflex as directed. Continue taking Tylenol and Ibuprofen as directed.

## 2021-07-25 ENCOUNTER — Encounter: Payer: Self-pay | Admitting: Emergency Medicine

## 2021-07-25 ENCOUNTER — Emergency Department
Admission: EM | Admit: 2021-07-25 | Discharge: 2021-07-25 | Disposition: A | Discharge status: Home / Self Care | Payer: Self-pay | Attending: Emergency Medicine | Admitting: Emergency Medicine

## 2021-07-25 ENCOUNTER — Other Ambulatory Visit: Payer: Self-pay

## 2021-07-25 DIAGNOSIS — R519 Headache, unspecified: Secondary | ICD-10-CM | POA: Insufficient documentation

## 2021-07-25 MED ORDER — BUTALBITAL-APAP-CAFFEINE 50-325-40 MG PO TABS: 1.0000 | ORAL_TABLET | Freq: Four times a day (QID) | ORAL | 0 refills | Status: DC | PRN

## 2021-07-25 NOTE — ED Notes (Signed)
Urine preg

## 2021-07-25 NOTE — ED Provider Notes (Signed)
? ?  [facility] ?Provider Note ? ? ? Event Date/Time  ? First MD Initiated Contact with Patient 07/25/21 1417   ?  (approximate) ? ? ?History  ? ?Headache ? ? ?HPI ? ?Rebecca Callahan is a 26 y.o. female with no significant past medical history who presents with complaints of a headache.  Patient reports over the last 3 to 4 weeks she has been having left-sided headaches that come and go.  She reports frequently she feels discomfort behind her left eye.  No changes in vision.  No neurodeficits.  Currently is feeling well.  No fevers chills or neck pain.  Has been taking ibuprofen and/or Tylenol with relief ?  ? ? ?Physical Exam  ? ?Triage Vital Signs: ?ED Triage Vitals  ?Enc Vitals Group  ?   BP 07/25/21 1447 110/70  ?   Pulse Rate 07/25/21 1447 80  ?   Resp 07/25/21 1450 18  ?   Temp 07/25/21 1450 99.5 ?F (37.5 ?C)  ?   Temp Source 07/25/21 1450 Oral  ?   SpO2 07/25/21 1447 98 %  ?   Weight 07/25/21 1231 63 kg (138 lb 14.2 oz)  ?   Height 07/25/21 1231 1.702 m (5\' 7" )  ?   Head Circumference --   ?   Peak Flow --   ?   Pain Score 07/25/21 1231 9  ?   Pain Loc --   ?   Pain Edu? --   ?   Excl. in GC? --   ? ? ?Most recent vital signs: ?Vitals:  ? 07/25/21 1450 07/25/21 1450  ?BP:    ?Pulse:    ?Resp: 18   ?Temp:  99.5 ?F (37.5 ?C)  ?SpO2:    ? ? ? ?General: Awake, no distress.  ?CV:  Good peripheral perfusion.  ?Resp:  Normal effort.  ?Abd:  No distention.  ?Other:  Normal neuro exam, PERRLA, EOMI ? ? ?ED Results / Procedures / Treatments  ? ?Labs ?(all labs ordered are listed, but only abnormal results are displayed) ?Labs Reviewed - No data to display ? ? ?EKG ? ? ? ? ?RADIOLOGY ? ? ? ? ?PROCEDURES: ? ?Critical Care performed:  ? ?Procedures ? ? ?MEDICATIONS ORDERED IN ED: ?Medications - No data to display ? ? ?IMPRESSION / MDM / ASSESSMENT AND PLAN / ED COURSE  ?I reviewed the triage vital signs and the nursing notes. ? ?Patient well-appearing and in no acute distress, afebrile here  with normal neuro exam. ? ?Appears comfortable and is ambulating and speaking normally ? ?Not on blood thinners, no trauma ? ?No red flag symptoms, no need for imaging at this time ? ?We will start the patient on Fioricet, recommend outpatient follow-up with PCP ? ? ? ? ? ?  ? ? ?FINAL CLINICAL IMPRESSION(S) / ED DIAGNOSES  ? ?Final diagnoses:  ?Acute nonintractable headache, unspecified headache type  ? ? ? ?Rx / DC Orders  ? ?ED Discharge Orders   ? ?      Ordered  ?  butalbital-acetaminophen-caffeine (FIORICET) 50-325-40 MG tablet  Every 6 hours PRN       ? 07/25/21 1517  ? ?  ?  ? ?  ? ? ? ?Note:  This document was prepared using Dragon voice recognition software and may include unintentional dictation errors. ?  ?09/24/21, MD ?07/25/21 1609 ? ?

## 2021-07-25 NOTE — ED Triage Notes (Signed)
C/O left fore head headache x 2-3 weeks. ? ?AAOx3.  Skin warm and dry. NAD ?

## 2022-02-15 ENCOUNTER — Emergency Department
Admission: EM | Admit: 2022-02-15 | Discharge: 2022-02-15 | Disposition: A | Discharge status: Home / Self Care | Payer: Self-pay | Attending: Student in an Organized Health Care Education/Training Program | Admitting: Student in an Organized Health Care Education/Training Program

## 2022-02-15 ENCOUNTER — Other Ambulatory Visit: Payer: Self-pay

## 2022-02-15 ENCOUNTER — Encounter: Payer: Self-pay | Admitting: Emergency Medicine

## 2022-02-15 DIAGNOSIS — R350 Frequency of micturition: Secondary | ICD-10-CM | POA: Insufficient documentation

## 2022-02-15 DIAGNOSIS — Z20822 Contact with and (suspected) exposure to covid-19: Secondary | ICD-10-CM | POA: Insufficient documentation

## 2022-02-15 DIAGNOSIS — Z794 Long term (current) use of insulin: Secondary | ICD-10-CM | POA: Insufficient documentation

## 2022-02-15 DIAGNOSIS — R739 Hyperglycemia, unspecified: Secondary | ICD-10-CM | POA: Insufficient documentation

## 2022-02-15 LAB — BASIC METABOLIC PANEL
Anion gap: 12 (ref 5–15)
BUN: 12 mg/dL (ref 6–20)
CO2: 24 mmol/L (ref 22–32)
Calcium: 9.4 mg/dL (ref 8.9–10.3)
Chloride: 97 mmol/L — ABNORMAL LOW (ref 98–111)
Creatinine, Ser: 0.6 mg/dL (ref 0.44–1.00)
GFR, Estimated: >60 mL/min (ref >60)
Glucose, Bld: 301 mg/dL — ABNORMAL HIGH (ref 70–99)
Potassium: 3.8 mmol/L (ref 3.5–5.1)
Sodium: 133 mmol/L — ABNORMAL LOW (ref 135–145)

## 2022-02-15 LAB — URINALYSIS, ROUTINE W REFLEX MICROSCOPIC
Bilirubin Urine: NEGATIVE
Glucose, UA: >=500 mg/dL — AB
Hgb urine dipstick: NEGATIVE
Ketones, ur: 80 mg/dL — AB
Nitrite: NEGATIVE
Protein, ur: NEGATIVE mg/dL
Specific Gravity, Urine: 1.041 — ABNORMAL HIGH (ref 1.005–1.030)
pH: 5 (ref 5.0–8.0)

## 2022-02-15 LAB — CBG MONITORING, ED
Glucose-Capillary: 314 mg/dL — ABNORMAL HIGH (ref 70–99)
Glucose-Capillary: 234 mg/dL — ABNORMAL HIGH (ref 70–99)
Glucose-Capillary: 172 mg/dL — ABNORMAL HIGH (ref 70–99)

## 2022-02-15 LAB — CBC
HCT: 37.5 % (ref 36.0–46.0)
Hemoglobin: 11 g/dL — ABNORMAL LOW (ref 12.0–15.0)
MCH: 22 pg — ABNORMAL LOW (ref 26.0–34.0)
MCHC: 29.3 g/dL — ABNORMAL LOW (ref 30.0–36.0)
MCV: 75.2 fL — ABNORMAL LOW (ref 80.0–100.0)
Platelets: 335 10*3/uL (ref 150–400)
RBC: 4.99 MIL/uL (ref 3.87–5.11)
RDW: 19 % — ABNORMAL HIGH (ref 11.5–15.5)
WBC: 5.2 10*3/uL (ref 4.0–10.5)
nRBC: 0 % (ref 0.0–0.2)

## 2022-02-15 LAB — POC URINE PREG, ED: Preg Test, Ur: NEGATIVE

## 2022-02-15 LAB — SARS CORONAVIRUS 2 BY RT PCR: SARS Coronavirus 2 by RT PCR: NEGATIVE

## 2022-02-15 MED ORDER — INSULIN ASPART 100 UNIT/ML IJ SOLN
6.0000 [IU] | Freq: Once | INTRAMUSCULAR | Status: AC
  Administered 2022-02-15: 6 [IU] via SUBCUTANEOUS

## 2022-02-15 MED ORDER — SODIUM CHLORIDE 0.9 % IV BOLUS
1000.0000 mL | Freq: Once | INTRAVENOUS | Status: AC
  Administered 2022-02-15: 1000 mL via INTRAVENOUS

## 2022-02-15 MED ORDER — NITROFURANTOIN MONOHYD MACRO 100 MG PO CAPS: 100.0000 mg | ORAL_CAPSULE | Freq: Two times a day (BID) | ORAL | 0 refills | Status: AC

## 2022-02-15 MED ORDER — INSULIN LISPRO (1 UNIT DIAL) 100 UNIT/ML (KWIKPEN): 1.0000 [IU] | PEN_INJECTOR | Freq: Three times a day (TID) | SUBCUTANEOUS | 11 refills | Status: DC

## 2022-02-15 MED ORDER — LACTATED RINGERS IV BOLUS
1000.0000 mL | Freq: Once | INTRAVENOUS | Status: AC
  Administered 2022-02-15: 1000 mL via INTRAVENOUS

## 2022-02-15 MED ORDER — NITROFURANTOIN MONOHYD MACRO 100 MG PO CAPS
100.0000 mg | ORAL_CAPSULE | Freq: Once | ORAL | Status: AC
  Administered 2022-02-15: 100 mg via ORAL
  Filled 2022-02-15: qty 1

## 2022-02-15 MED ORDER — INSULIN ASPART 100 UNIT/ML IJ SOLN
10.0000 [IU] | Freq: Once | INTRAMUSCULAR | Status: DC
  Filled 2022-02-15: qty 1

## 2022-02-15 NOTE — ED Notes (Signed)
Pt is A&Ox4. Pt denies pain. Pt is type 1 diabetic and usually maintains BG 150. Pt's BG was elevated today and was shaky. Pt denies any pain, N/V.

## 2022-02-15 NOTE — ED Provider Notes (Signed)
[facility] Provider Note    Event Date/Time   First MD Initiated Contact with Patient 02/15/22 2028     (approximate)   History   Hyperglycemia   HPI  Rebecca Callahan is a 26 y.o. female previously healthy who presents to the ER for evaluation of elevated blood sugars over the past several days.  Has been taking her long-acting but states that she is running low on her short acting insulin.  States that she is having more frequent urination and some discomfort.  No nausea or vomiting.  No fevers or chills.     Physical Exam   Triage Vital Signs: ED Triage Vitals  Enc Vitals Group     BP 02/15/22 1759 (!) 134/94     Pulse Rate 02/15/22 1759 93     Resp 02/15/22 1759 18     Temp 02/15/22 1759 98.2 F (36.8 C)     Temp Source 02/15/22 1759 Oral     SpO2 02/15/22 1759 100 %     Weight 02/15/22 1757 140 lb (63.5 kg)     Height 02/15/22 1757 5\' 7"  (1.702 m)     Head Circumference --      Peak Flow --      Pain Score 02/15/22 1757 0     Pain Loc --      Pain Edu? --      Excl. in [place]? --     Most recent vital signs: Vitals:   02/15/22 2100 02/15/22 2223  BP: 122/89 128/83  Pulse: 79 88  Resp: 16 16  Temp: 98.2 F (36.8 C)   SpO2: 100% 100%     Constitutional: Alert  Eyes: Conjunctivae are normal.  Head: Atraumatic. Nose: No congestion/rhinnorhea. Mouth/Throat: Mucous membranes are moist.   Neck: Painless ROM.  Cardiovascular:   Good peripheral circulation. Respiratory: Normal respiratory effort.  No retractions.  Gastrointestinal: Soft and nontender.  Musculoskeletal:  no deformity Neurologic:  MAE spontaneously. No gross focal neurologic deficits are appreciated.  Skin:  Skin is warm, dry and intact. No rash noted. Psychiatric: Mood and affect are normal. Speech and behavior are normal.    ED Results / Procedures / Treatments   Labs (all labs ordered are listed, but only abnormal results are displayed) Labs Reviewed   BASIC METABOLIC PANEL - Abnormal; Notable for the following components:      Result Value   Sodium 133 (*)    Chloride 97 (*)    Glucose, Bld 301 (*)    All other components within normal limits  CBC - Abnormal; Notable for the following components:   Hemoglobin 11.0 (*)    MCV 75.2 (*)    MCH 22.0 (*)    MCHC 29.3 (*)    RDW 19.0 (*)    All other components within normal limits  URINALYSIS, ROUTINE W REFLEX MICROSCOPIC - Abnormal; Notable for the following components:   Color, Urine YELLOW (*)    APPearance HAZY (*)    Specific Gravity, Urine 1.041 (*)    Glucose, UA >=500 (*)    Ketones, ur 80 (*)    Leukocytes,Ua TRACE (*)    Bacteria, UA RARE (*)    All other components within normal limits  CBG MONITORING, ED - Abnormal; Notable for the following components:   Glucose-Capillary 314 (*)    All other components within normal limits  CBG MONITORING, ED - Abnormal; Notable for the following components:   Glucose-Capillary 234 (*)  All other components within normal limits  CBG MONITORING, ED - Abnormal; Notable for the following components:   Glucose-Capillary 172 (*)    All other components within normal limits  SARS CORONAVIRUS 2 BY RT PCR  POC URINE PREG, ED     EKG     RADIOLOGY    PROCEDURES:  Critical Care performed:   Procedures   MEDICATIONS ORDERED IN ED: Medications  sodium chloride 0.9 % bolus 1,000 mL (0 mLs Intravenous Stopped 02/15/22 1938)  lactated ringers bolus 1,000 mL (0 mLs Intravenous Stopped 02/15/22 2223)  insulin aspart (novoLOG) injection 6 Units (6 Units Subcutaneous Given 02/15/22 2108)  nitrofurantoin (macrocrystal-monohydrate) (MACROBID) capsule 100 mg (100 mg Oral Given 02/15/22 2224)     IMPRESSION / MDM / ASSESSMENT AND PLAN / ED COURSE  I reviewed the triage vital signs and the nursing notes.                              Differential diagnosis includes, but is not limited to, hyperglycemia, DKA, HHS,  sepsis,  Patient presents to the ER for evaluation of symptoms as described above.  This presenting complaint could reflect a potentially life-threatening illness therefore the patient will be placed on continuous pulse oximetry and telemetry for monitoring.  Laboratory evaluation will be sent to evaluate for the above complaints.  She is clinically well appearing however.  Blood work ordered at triage does not show any evidence of DKA.  She does have trace ketones but no anion gap or acidosis.  No leukocytosis.  Will give IV fluids will treat with insulin will reassess.   Clinical Course as of 02/15/22 2300  Wed Feb 15, 2022  2214 Patient reassessed.  Glucoses appropriately correcting.  Has been out of her fast acting insulin will refill she is not in DKA.  Is complaining some dysuria but not septic will cover with antibiotic [PR]  Friona.  Does appear stable and appropriate for outpatient follow-up. [PR]    Clinical Course User Index [PR] Merlyn Lot, MD     FINAL CLINICAL IMPRESSION(S) / ED DIAGNOSES   Final diagnoses:  Hyperglycemia     Rx / DC Orders   ED Discharge Orders          Ordered    nitrofurantoin, macrocrystal-monohydrate, (MACROBID) 100 MG capsule  2 times daily        02/15/22 2216    insulin lispro (HUMALOG) 100 UNIT/ML KwikPen  3 times daily        02/15/22 2216             Note:  This document was prepared using Dragon voice recognition software and may include unintentional dictation errors.    Merlyn Lot, MD 02/15/22 2300

## 2022-02-15 NOTE — ED Triage Notes (Signed)
Pt via POV from home. Pt c/o hyperglycemia since Sunday. States she feels shaking pt is Type 1 DM. States that she checked her sugar this AM and it was 342. Denies pain and denies nausea. Pt is A&Ox4 and NAD.

## 2022-02-15 NOTE — ED Provider Triage Note (Signed)
Emergency Medicine Provider Triage Evaluation Note  Rebecca Callahan , a 26 y.o. female  was evaluated in triage.  Pt complains of elevated glucose, urinary freq and dysuria. No fever or chills.  Review of Systems  Positive:  Negative:   Physical Exam  BP (!) 134/94   Pulse 93   Temp 98.2 F (36.8 C) (Oral)   Resp 18   Ht 5\' 7"  (1.702 m)   Wt 63.5 kg   LMP 01/20/2022   SpO2 100%   BMI 21.93 kg/m  Gen:   Awake, no distress   Resp:  Normal effort  MSK:   Moves extremities without difficulty  Other:    Medical Decision Making  Medically screening exam initiated at 6:03 PM.  Appropriate orders placed.  Rebecca Callahan was informed that the remainder of the evaluation will be completed by another provider, this initial triage assessment does not replace that evaluation, and the importance of remaining in the ED until their evaluation is complete.  Patient is insulin dependent, fingerstick is over 300, will give 1L NS   Versie Starks, PA-C 02/15/22 1805

## 2022-06-07 ENCOUNTER — Emergency Department
Admission: EM | Admit: 2022-06-07 | Discharge: 2022-06-07 | Disposition: A | Discharge status: Home / Self Care | Payer: Medicaid Other | Attending: Emergency Medicine | Admitting: Emergency Medicine

## 2022-06-07 ENCOUNTER — Other Ambulatory Visit: Payer: Self-pay

## 2022-06-07 DIAGNOSIS — L02412 Cutaneous abscess of left axilla: Secondary | ICD-10-CM | POA: Insufficient documentation

## 2022-06-07 DIAGNOSIS — A549 Gonococcal infection, unspecified: Secondary | ICD-10-CM | POA: Insufficient documentation

## 2022-06-07 DIAGNOSIS — R1030 Lower abdominal pain, unspecified: Secondary | ICD-10-CM | POA: Diagnosis present

## 2022-06-07 DIAGNOSIS — L02419 Cutaneous abscess of limb, unspecified: Secondary | ICD-10-CM

## 2022-06-07 LAB — COMPREHENSIVE METABOLIC PANEL
ALT: 9 U/L (ref 0–44)
AST: 14 U/L — ABNORMAL LOW (ref 15–41)
Albumin: 3.8 g/dL (ref 3.5–5.0)
Alkaline Phosphatase: 62 U/L (ref 38–126)
Anion gap: 10 (ref 5–15)
BUN: 15 mg/dL (ref 6–20)
CO2: 24 mmol/L (ref 22–32)
Calcium: 9 mg/dL (ref 8.9–10.3)
Chloride: 97 mmol/L — ABNORMAL LOW (ref 98–111)
Creatinine, Ser: 0.66 mg/dL (ref 0.44–1.00)
GFR, Estimated: >60 mL/min (ref >60)
Glucose, Bld: 309 mg/dL — ABNORMAL HIGH (ref 70–99)
Potassium: 4 mmol/L (ref 3.5–5.1)
Sodium: 131 mmol/L — ABNORMAL LOW (ref 135–145)
Total Bilirubin: 0.9 mg/dL (ref 0.3–1.2)
Total Protein: 8.8 g/dL — ABNORMAL HIGH (ref 6.5–8.1)

## 2022-06-07 LAB — CBC WITH DIFFERENTIAL/PLATELET
Abs Immature Granulocytes: 0.02 10*3/uL (ref 0.00–0.07)
Basophils Absolute: 0 10*3/uL (ref 0.0–0.1)
Basophils Relative: 1 %
Eosinophils Absolute: 0.1 10*3/uL (ref 0.0–0.5)
Eosinophils Relative: 2 %
HCT: 36.2 % (ref 36.0–46.0)
Hemoglobin: 10.3 g/dL — ABNORMAL LOW (ref 12.0–15.0)
Immature Granulocytes: 0 %
Lymphocytes Relative: 20 %
Lymphs Abs: 1.4 10*3/uL (ref 0.7–4.0)
MCH: 21.4 pg — ABNORMAL LOW (ref 26.0–34.0)
MCHC: 28.5 g/dL — ABNORMAL LOW (ref 30.0–36.0)
MCV: 75.1 fL — ABNORMAL LOW (ref 80.0–100.0)
Monocytes Absolute: 1 10*3/uL (ref 0.1–1.0)
Monocytes Relative: 15 %
Neutro Abs: 4.4 10*3/uL (ref 1.7–7.7)
Neutrophils Relative %: 62 %
Platelets: 326 10*3/uL (ref 150–400)
RBC: 4.82 MIL/uL (ref 3.87–5.11)
RDW: 16.4 % — ABNORMAL HIGH (ref 11.5–15.5)
WBC: 7 10*3/uL (ref 4.0–10.5)
nRBC: 0 % (ref 0.0–0.2)

## 2022-06-07 LAB — WET PREP, GENITAL
Clue Cells Wet Prep HPF POC: NONE SEEN
Sperm: NONE SEEN
Trich, Wet Prep: NONE SEEN
WBC, Wet Prep HPF POC: >=10 — AB (ref <10)
Yeast Wet Prep HPF POC: NONE SEEN

## 2022-06-07 LAB — URINALYSIS, ROUTINE W REFLEX MICROSCOPIC
Bacteria, UA: NONE SEEN
Bilirubin Urine: NEGATIVE
Glucose, UA: >=500 mg/dL — AB
Ketones, ur: NEGATIVE mg/dL
Nitrite: NEGATIVE
Protein, ur: NEGATIVE mg/dL
Specific Gravity, Urine: 1.029 (ref 1.005–1.030)
WBC, UA: >50 WBC/hpf — ABNORMAL HIGH (ref 0–5)
pH: 6 (ref 5.0–8.0)

## 2022-06-07 LAB — CHLAMYDIA/NGC RT PCR (ARMC ONLY)
Chlamydia Tr: NOT DETECTED
N gonorrhoeae: DETECTED — AB

## 2022-06-07 LAB — HIV ANTIBODY (ROUTINE TESTING W REFLEX): HIV Screen 4th Generation wRfx: NONREACTIVE

## 2022-06-07 LAB — LIPASE, BLOOD: Lipase: 26 U/L (ref 11–51)

## 2022-06-07 LAB — POC URINE PREG, ED: Preg Test, Ur: NEGATIVE

## 2022-06-07 MED ORDER — SODIUM CHLORIDE 0.9 % IV BOLUS
1000.0000 mL | Freq: Once | INTRAVENOUS | Status: AC
  Administered 2022-06-07: 1000 mL via INTRAVENOUS

## 2022-06-07 MED ORDER — LIDOCAINE-EPINEPHRINE (PF) 2 %-1:200000 IJ SOLN
10.0000 mL | Freq: Once | INTRAMUSCULAR | Status: DC
  Filled 2022-06-07: qty 10

## 2022-06-07 MED ORDER — CEFTRIAXONE SODIUM 500 MG IJ SOLR
500.0000 mg | Freq: Once | INTRAMUSCULAR | Status: DC
  Filled 2022-06-07: qty 500

## 2022-06-07 MED ORDER — DOXYCYCLINE MONOHYDRATE 100 MG PO TABS: 100.0000 mg | ORAL_TABLET | Freq: Two times a day (BID) | ORAL | 0 refills | Status: AC

## 2022-06-07 MED ORDER — SODIUM CHLORIDE 0.9 % IV SOLN
1.0000 g | Freq: Once | INTRAVENOUS | Status: AC
  Administered 2022-06-07: 1 g via INTRAVENOUS
  Filled 2022-06-07: qty 10

## 2022-06-07 MED ORDER — LIDOCAINE-EPINEPHRINE (PF) 2 %-1:200000 IJ SOLN
20.0000 mL | Freq: Once | INTRAMUSCULAR | Status: AC
  Administered 2022-06-07: 20 mL
  Filled 2022-06-07: qty 20

## 2022-06-07 NOTE — ED Provider Notes (Signed)
[facility] Provider Note    Event Date/Time   First MD Initiated Contact with Patient 06/07/22 1101     (approximate)   History   Chief Complaint Abdominal Pain   HPI Rebecca Callahan is a 27 y.o. female, no significant medical history, presents to the emergency department for evaluation of abdominal pain x 2 days.  Hurts in the midline lower abdomen.  Endorses a burning sensation after she urinates.  In addition, she states that she is having some vaginal discharge as well that is "green" in color.  She states that she has had the same partner for the past few months, but is unsure if she has been his only partner.  In addition, she states that she has had a boil/abscess growing in her left armpit over the past week.  She thought that it would pop on its own, but it has not.  Denies fever/chills, myalgias, chest pain, shortness of breath, flank pain, nausea/vomiting, diarrhea, headache, restless lesions, weakness, or dizziness/lightheadedness.  History Limitations: No limitations.        Physical Exam  Triage Vital Signs: ED Triage Vitals [06/07/22 0945]  Enc Vitals Group     BP 129/87     Pulse Rate (!) 103     Resp 18     Temp 98.7 F (37.1 C)     Temp Source Oral     SpO2 99 %     Weight      Height      Head Circumference      Peak Flow      Pain Score 8     Pain Loc      Pain Edu?      Excl. in GC?     Most recent vital signs: Vitals:   06/07/22 0945 06/07/22 1442  BP: 129/87 127/81  Pulse: (!) 103 87  Resp: 18 20  Temp: 98.7 F (37.1 C) 98.6 F (37 C)  SpO2: 99% 100%    General: Awake, NAD.  Skin: Warm, dry. No rashes or lesions.  Eyes: PERRL. Conjunctivae normal.  CV: Good peripheral perfusion.  Resp: Normal effort.  Abd: Soft, non-tender. No distention.  Neuro: At baseline. No gross neurological deficits.  Musculoskeletal: Normal ROM of all extremities.  Focused Exam: Tenderness appreciated overlying the  bladder region.   There does appear to be a circumferential mass in the left armpit.  Tender with palpation.  Moderate fluctuance present as well.  No active bleeding or discharge.  Point-of-care ultrasound shows a 2 x 2 centimeter area of induration with approximately 1 x 1 cm of fluid.  Physical Exam    ED Results / Procedures / Treatments  Labs (all labs ordered are listed, but only abnormal results are displayed) Labs Reviewed  WET PREP, GENITAL - Abnormal; Notable for the following components:      Result Value   WBC, Wet Prep HPF POC >=10 (*)    All other components within normal limits  CHLAMYDIA/NGC RT PCR ([facility] ONLY)           - Abnormal; Notable for the following components:   N gonorrhoeae DETECTED (*)    All other components within normal limits  CBC WITH DIFFERENTIAL/PLATELET - Abnormal; Notable for the following components:   Hemoglobin 10.3 (*)    MCV 75.1 (*)    MCH 21.4 (*)    MCHC 28.5 (*)    RDW 16.4 (*)    All other components within  normal limits  COMPREHENSIVE METABOLIC PANEL - Abnormal; Notable for the following components:   Sodium 131 (*)    Chloride 97 (*)    Glucose, Bld 309 (*)    Total Protein 8.8 (*)    AST 14 (*)    All other components within normal limits  URINALYSIS, ROUTINE W REFLEX MICROSCOPIC - Abnormal; Notable for the following components:   Color, Urine YELLOW (*)    APPearance CLOUDY (*)    Glucose, UA >=500 (*)    Hgb urine dipstick SMALL (*)    Leukocytes,Ua LARGE (*)    WBC, UA >50 (*)    All other components within normal limits  LIPASE, BLOOD  HIV ANTIBODY (ROUTINE TESTING W REFLEX)  RPR  POC URINE PREG, ED     EKG N/A.    RADIOLOGY  ED Provider Interpretation: N/A.  No results found.  PROCEDURES:  Critical Care performed: N/A.  Ultrasound ED Soft Tissue  Date/Time: 06/07/2022 6:35 PM  Performed by: Teodoro Spray, PA Authorized by: Teodoro Spray, PA   Procedure details:    Indications:  localization of abscess     Transverse view:  Visualized   Longitudinal view:  Visualized   Images: not archived     Limitations:  Positioning Location:    Location: axilla   Findings:     abscess present    no cellulitis present    no foreign body present .Marland KitchenIncision and Drainage  Date/Time: 06/07/2022 6:36 PM  Performed by: Teodoro Spray, PA Authorized by: Teodoro Spray, PA   Consent:    Consent obtained:  Verbal   Consent given by:  Patient   Risks, benefits, and alternatives were discussed: yes     Risks discussed:  Bleeding   Alternatives discussed:  No treatment Universal protocol:    Patient identity confirmed:  Verbally with patient Location:    Type:  Abscess   Size:  2 cm   Location:  Upper extremity   Upper extremity location:  Arm   Arm location:  L upper arm Pre-procedure details:    Skin preparation:  Antiseptic wash Sedation:    Sedation type:  None Anesthesia:    Anesthesia method:  Local infiltration   Local anesthetic:  Lidocaine 2% WITH epi Procedure type:    Complexity:  Simple Procedure details:    Incision types:  Stab incision   Incision depth:  Dermal   Drainage:  Purulent and bloody   Drainage amount:  Moderate   Wound treatment:  Wound left open   Packing materials:  None Post-procedure details:    Procedure completion:  Tolerated well, no immediate complications     MEDICATIONS ORDERED IN ED: Medications  sodium chloride 0.9 % bolus 1,000 mL (0 mLs Intravenous Stopped 06/07/22 1412)  lidocaine-EPINEPHrine (XYLOCAINE W/EPI) 2 %-1:200000 (PF) injection 20 mL (20 mLs Infiltration Given 06/07/22 1414)  cefTRIAXone (ROCEPHIN) 1 g in sodium chloride 0.9 % 100 mL IVPB (0 g Intravenous Stopped 06/07/22 1441)     IMPRESSION / MDM / ASSESSMENT AND PLAN / ED COURSE  I reviewed the triage vital signs and the nursing notes.                              Differential diagnosis includes, but is not limited to, cystitis,  pyelonephritis, vaginal candidiasis, chlamydia/gonorrhea, trichomoniasis, bacterial vaginosis, hidradenitis, axillary abscess, cellulitis.  ED Course Patient appears well, vitals within normal limits.  NAD.  CBC shows no leukocytosis.  Anemia present at 10.3 hemoglobin, consistent with baseline.  CMP shows slightly decreased sodium at 131 and elevated glucose at 209.  Likely pseudohyponatremia in the setting of hyperglycemia.  She states that she does have diabetes and takes insulin regularly.  She had a large meal this morning.  No transaminitis or AKI.  Will initiate IV fluids.  Urinalysis shows large amount of leukocytes, otherwise no bacteria or nitrites.  Wet prep shows increased WBCs, otherwise unremarkable.  Clay/gonorrhea screening positive for gonorrhea.  Negative for chlamydia.  HIV nonreactive.  Assessment/Plan Patient presents with left axillary abscess and complaint of dysuria/vaginal discharge.  She was found to be positive for gonorrhea, negative for chlamydia.  Provide her with a single dose ceftriaxone 1 g via IV.  No evidence of systemic infection.  No signs or symptoms of pelvic inflammatory disease.  Lab workup is reassuring.  In regards to the axillary abscess, we were able to successfully perform a incision and drainage.  She tolerated the procedure well with no immediate complications.  No packing needed.  Will provide her with a short course of doxycycline.  Encouraged her to follow with her primary care provider as needed.  Will discharge.  Considered admission for this patient, but given her stable presentation and access to follow-up, she is unlikely benefit from admission.  Provided the patient with anticipatory guidance, return precautions, and educational material. Encouraged the patient to return to the emergency department at any time if they begin to experience any new or worsening symptoms. Patient expressed understanding and agreed with the plan.   Patient's  presentation is most consistent with acute complicated illness / injury requiring diagnostic workup.       FINAL CLINICAL IMPRESSION(S) / ED DIAGNOSES   Final diagnoses:  Gonorrhea  Axillary abscess     Rx / DC Orders   ED Discharge Orders          Ordered    doxycycline (ADOXA) 100 MG tablet  2 times daily        06/07/22 1426             Note:  This document was prepared using Dragon voice recognition software and may include unintentional dictation errors.   Teodoro Spray, [place] 06/07/22 Kristian Covey    Lucillie Garfinkel, MD 06/08/22 [phone]

## 2022-06-07 NOTE — ED Triage Notes (Signed)
Pt to ED via POV from home. Pt reports lower abdominal pain x2 days. Pt denies N/V/D. Pt also reports she has a boil under her left armpit that is getting bigger and more painful that she would like checked.

## 2022-06-07 NOTE — Discharge Instructions (Addendum)
-  It appears you did test positive for gonorrhea, which I suspect is likely the source of your lower abdominal symptoms and vaginal discharge.  We have already given you the appropriate treatment with the ceftriaxone here.  Your symptoms should improve over the next few days.  -In regards to your axillary abscess, recommend washing daily with soap and water.  I provided you with a prescription for doxycycline to take to help clear the infection.  Please take the full course as prescribed.  -Please notify any recent sexual partners of your infection, as it is likely that they have it to.  Encouraged him to follow-up with her regular doctors or return here to the emergency department.  -Please take your diabetes medication as prescribed.  Follow-up with your primary care provider as needed.  -Return to the emergency department anytime if you begin to experience any new or worsening symptoms.

## 2022-06-07 NOTE — Inpatient Diabetes Management (Addendum)
Inpatient Diabetes Program Recommendations  AACE/ADA: New Consensus Statement on Inpatient Glycemic Control (2015)  Target Ranges:  Prepandial:   less than 140 mg/dL      Peak postprandial:   less than 180 mg/dL (1-2 hours)      Critically ill patients:  140 - 180 mg/dL   Lab Results  Component Value Date   GLUCAP 172 (H) 02/15/2022    Review of Glycemic Control  Diabetes history: DM1 Outpatient Diabetes medications:  Lantus 12 units QHS Novolog 1 unit for every 15 grams og CHO's Novolog SSI Current orders for Inpatient glycemic control:  None  Inpatient Diabetes Program Recommendations:    Has T1DM-Does not make insulin  Please consider:  Novolog 0-9 units Q4H If admitted please order Lantus 10 units QHS  Spoke with patient at bedside.  She states her Endocrinologist is in [place].  She has not had an appointment with them in over a year.  They have refilled her insulin.  She states she buys 1 pen at a time for $30/pen.  She checks her glucose ac/hs.  She used to be under the [employer] medication assistance program but was dropped when she did not f/u with her endocrinologist.  She states she is going to make an appointment and pay out of pocket.  She lives in [place].  Explained that she could establish with [facility] or [facility] and receive assistance through them.  Will place [facility] consult.    She has occasional lows and treats with O.J. or candy.  She will sometime check her glucose 2 hours after eating and administer more insulin for high blood sugars.  Explained insulin takes 1-3 hrs to peak and she should wait 4 hrs to treat hyperglycemia from last insulin dose.  She verbalizes understanding.   Will continue to follow while inpatient.  Thank you, Reche Dixon, MSN, [place] Diabetes Coordinator Inpatient Diabetes Program [phone] (team pager from 8a-5p)

## 2022-06-08 LAB — RPR: RPR Ser Ql: NONREACTIVE

## 2022-06-19 ENCOUNTER — Emergency Department
Admission: EM | Admit: 2022-06-19 | Discharge: 2022-06-19 | Disposition: A | Discharge status: Home / Self Care | Payer: Medicaid Other | Attending: Emergency Medicine | Admitting: Emergency Medicine

## 2022-06-19 ENCOUNTER — Other Ambulatory Visit: Payer: Self-pay

## 2022-06-19 ENCOUNTER — Encounter: Payer: Self-pay | Admitting: Emergency Medicine

## 2022-06-19 DIAGNOSIS — N751 Abscess of Bartholin's gland: Secondary | ICD-10-CM | POA: Diagnosis not present

## 2022-06-19 DIAGNOSIS — N764 Abscess of vulva: Secondary | ICD-10-CM | POA: Diagnosis present

## 2022-06-19 LAB — CHLAMYDIA/NGC RT PCR (ARMC ONLY)
Chlamydia Tr: NOT DETECTED
N gonorrhoeae: NOT DETECTED

## 2022-06-19 LAB — WET PREP, GENITAL
Clue Cells Wet Prep HPF POC: NONE SEEN
Sperm: NONE SEEN
WBC, Wet Prep HPF POC: <10 (ref <10)
Yeast Wet Prep HPF POC: NONE SEEN

## 2022-06-19 MED ORDER — OXYCODONE-ACETAMINOPHEN 5-325 MG PO TABS: 1.0000 | ORAL_TABLET | Freq: Three times a day (TID) | ORAL | 0 refills | Status: DC | PRN

## 2022-06-19 MED ORDER — METRONIDAZOLE 500 MG PO TABS: 500.0000 mg | ORAL_TABLET | Freq: Two times a day (BID) | ORAL | 0 refills | Status: AC

## 2022-06-19 MED ORDER — SULFAMETHOXAZOLE-TRIMETHOPRIM 800-160 MG PO TABS: 1.0000 | ORAL_TABLET | Freq: Two times a day (BID) | ORAL | 0 refills | Status: AC

## 2022-06-19 MED ORDER — LIDOCAINE-EPINEPHRINE (PF) 2 %-1:200000 IJ SOLN
10.0000 mL | Freq: Once | INTRAMUSCULAR | Status: AC
  Administered 2022-06-19: 10 mL
  Filled 2022-06-19: qty 20

## 2022-06-19 NOTE — ED Triage Notes (Signed)
Presents with possible abscess arm under arm

## 2022-06-19 NOTE — ED Provider Notes (Signed)
[facility] Provider Note    Event Date/Time   First MD Initiated Contact with Patient 06/19/22 1155     (approximate)   History   Chief Complaint Abscess   HPI Rebecca Callahan is a 27 y.o. female, no significant medical history, presents to the emergency department for evaluation of suspected labial abscess.  Patient states that she noticed a abscess developing along the left labia approximately 7 days ago.  It has progressively worsened.  She was recently seen here for an abscess on 06/07/2022 on her left armpit, which has improved since having the incision and drainage procedure and being on doxycycline.  She was also found to be positive for gonorrhea, which was treated with ceftriaxone.  Aside from the development of the abscess, she is not having any additional symptoms.  Denies fevers or chills, allergies, chest pain, shortness of breath, abdominal pain, nausea/vomiting, diarrhea, urinary symptoms, headache, weakness, rashes/lesions, or dizziness/lightheadedness.  History Limitations: No limitations.        Physical Exam  Triage Vital Signs: ED Triage Vitals  Enc Vitals Group     BP 06/19/22 1141 105/71     Pulse Rate 06/19/22 1141 100     Resp 06/19/22 1141 18     Temp 06/19/22 1141 97.9 F (36.6 C)     Temp Source 06/19/22 1141 Oral     SpO2 06/19/22 1141 98 %     Weight 06/19/22 1140 139 lb 15.9 oz (63.5 kg)     Height 06/19/22 1140 5\' 7"  (1.702 m)     Head Circumference --      Peak Flow --      Pain Score --      Pain Loc --      Pain Edu? --      Excl. in [place]? --     Most recent vital signs: Vitals:   06/19/22 1141  BP: 105/71  Pulse: 100  Resp: 18  Temp: 97.9 F (36.6 C)  SpO2: 98%    General: Awake, NAD.  Skin: Warm, dry. No rashes or lesions.  Eyes: PERRL. Conjunctivae normal.  CV: Good peripheral perfusion.  Resp: Normal effort.  Abd: Soft, non-tender. No distention.  Neuro: At baseline. No gross  neurological deficits.  Musculoskeletal: Normal ROM of all extremities.  Focused Exam: Large, diffuse swelling across the left labia with involvement of the clitoris as well.  It does appear to be a large area of fluctuance along the inner mucosa of the left labia.  No active bleeding or discharge at this time.  Patient unable to tolerate full pelvic exam.  Physical Exam    ED Results / Procedures / Treatments  Labs (all labs ordered are listed, but only abnormal results are displayed) Labs Reviewed  WET PREP, GENITAL - Abnormal; Notable for the following components:      Result Value   Trich, Wet Prep PRESENT (*)    All other components within normal limits  CHLAMYDIA/NGC RT PCR ([facility] ONLY)               EKG N/A.    RADIOLOGY  ED Provider Interpretation: N/A.  No results found.  PROCEDURES:  Critical Care performed: N/A.  06/21/22.Incision and Drainage  Date/Time: 06/19/2022 4:58 PM  Performed by: 06/21/2022, PA Authorized by: Varney Daily, PA   Consent:    Consent obtained:  Verbal   Consent given by:  Patient   Risks, benefits, and alternatives were discussed:  yes     Risks discussed:  Damage to other organs, infection, incomplete drainage, pain and bleeding   Alternatives discussed:  No treatment Universal protocol:    Patient identity confirmed:  Verbally with patient Location:    Type:  Bartholin cyst   Size:  3 cm   Location:  Anogenital   Anogenital location:  Bartholin's gland Pre-procedure details:    Skin preparation:  Antiseptic wash Sedation:    Sedation type:  None Anesthesia:    Anesthesia method:  Local infiltration   Local anesthetic:  Lidocaine 2% WITH epi Procedure type:    Complexity:  Simple Procedure details:    Incision types:  Stab incision   Incision depth:  Dermal   Wound management:  Probed and deloculated and irrigated with saline   Drainage:  Purulent   Drainage amount:  Copious   Wound treatment:  Drain  placed and wound left open   Packing materials:  Word catheter Post-procedure details:    Procedure completion:  Tolerated well, no immediate complications     MEDICATIONS ORDERED IN ED: Medications  lidocaine-EPINEPHrine (XYLOCAINE W/EPI) 2 %-1:200000 (PF) injection 10 mL (10 mLs Infiltration Given by Other 06/19/22 1316)     IMPRESSION / MDM / ASSESSMENT AND PLAN / ED COURSE  I reviewed the triage vital signs and the nursing notes.                              Differential diagnosis includes, but is not limited to, Bartholin cyst, Bartholin abscess, chlamydia, gonorrhea, trichomoniasis, vaginal yeast infection,    Assessment/Plan Presentation consistent with Bartholin gland abscess.  No signs of any systemic infection.  She was already recently treated for gonorrhea/chlamydia, will repeat testing and follow-up as needed.  She was found to be positive for trichomoniasis.  Will provide her with metronidazole.  I was able to successfully perform incision and drainage of the buttock gladden abscess.  Placed a Word catheter.  She tolerated the procedure well with no immediate complications.  I will place her on Bactrim to cover for this as well.  Advised her that she will need to follow-up with OB/GYN for Word catheter removal within the next  4 weeks.  She expressed understanding and agreed.  Will discharge.  Provided the patient with anticipatory guidance, return precautions, and educational material. Encouraged the patient to return to the emergency department at any time if they begin to experience any new or worsening symptoms. Patient expressed understanding and agreed with the plan.   Patient's presentation is most consistent with acute complicated illness / injury requiring diagnostic workup.       FINAL CLINICAL IMPRESSION(S) / ED DIAGNOSES   Final diagnoses:  Bartholin's gland abscess     Rx / DC Orders   ED Discharge Orders          Ordered     sulfamethoxazole-trimethoprim (BACTRIM DS) 800-160 MG tablet  2 times daily        06/19/22 1504    oxyCODONE-acetaminophen (PERCOCET/ROXICET) 5-325 MG tablet  Every 8 hours PRN        06/19/22 1512    metroNIDAZOLE (FLAGYL) 500 MG tablet  2 times daily        06/19/22 1648             Note:  This document was prepared using Dragon voice recognition software and may include unintentional dictation errors.   Teodoro Spray, [place] 06/19/22  1659    Nathaniel Man, MD 06/20/22 [phone]

## 2022-06-19 NOTE — Discharge Instructions (Addendum)
-  It appears you have a Bartholin gland abscess.  Please review the educational material provided.  The catheter will stay in place for the next 3 to 4 weeks.  This will help prevent recurrence.  In addition, you will need to take the full course of the antibiotics as prescribed.  Please follow-up with the OB/GYN listed on this page for reevaluation.  -For the pain, you may take Tylenol/ibuprofen.  If needed, you may take the oxycodone as well.  -Return to the emergency department anytime if you begin to experience any new or worsening symptoms.

## 2022-08-08 ENCOUNTER — Other Ambulatory Visit: Payer: Self-pay

## 2022-08-08 ENCOUNTER — Inpatient Hospital Stay
Admission: EM | Admit: 2022-08-08 | Discharge: 2022-08-09 | DRG: 638 | Disposition: A | Discharge status: Home / Self Care | Payer: Medicaid Other | Attending: Internal Medicine | Admitting: Internal Medicine

## 2022-08-08 DIAGNOSIS — R7989 Other specified abnormal findings of blood chemistry: Secondary | ICD-10-CM | POA: Diagnosis not present

## 2022-08-08 DIAGNOSIS — E1110 Type 2 diabetes mellitus with ketoacidosis without coma: Secondary | ICD-10-CM | POA: Diagnosis not present

## 2022-08-08 DIAGNOSIS — E1010 Type 1 diabetes mellitus with ketoacidosis without coma: Principal | ICD-10-CM | POA: Diagnosis present

## 2022-08-08 DIAGNOSIS — Z91148 Patient's other noncompliance with medication regimen for other reason: Secondary | ICD-10-CM

## 2022-08-08 DIAGNOSIS — Z681 Body mass index (BMI) 19 or less, adult: Secondary | ICD-10-CM | POA: Diagnosis not present

## 2022-08-08 DIAGNOSIS — Z880 Allergy status to penicillin: Secondary | ICD-10-CM | POA: Diagnosis not present

## 2022-08-08 DIAGNOSIS — Z794 Long term (current) use of insulin: Secondary | ICD-10-CM | POA: Diagnosis not present

## 2022-08-08 DIAGNOSIS — T383X6A Underdosing of insulin and oral hypoglycemic [antidiabetic] drugs, initial encounter: Secondary | ICD-10-CM | POA: Diagnosis present

## 2022-08-08 DIAGNOSIS — E872 Acidosis, unspecified: Secondary | ICD-10-CM

## 2022-08-08 DIAGNOSIS — Z23 Encounter for immunization: Secondary | ICD-10-CM

## 2022-08-08 DIAGNOSIS — Z8632 Personal history of gestational diabetes: Secondary | ICD-10-CM | POA: Diagnosis not present

## 2022-08-08 DIAGNOSIS — E860 Dehydration: Secondary | ICD-10-CM | POA: Diagnosis present

## 2022-08-08 DIAGNOSIS — E440 Moderate protein-calorie malnutrition: Secondary | ICD-10-CM | POA: Diagnosis present

## 2022-08-08 LAB — COMPREHENSIVE METABOLIC PANEL
ALT: 10 U/L (ref 0–44)
AST: 29 U/L (ref 15–41)
Albumin: 3.9 g/dL (ref 3.5–5.0)
Alkaline Phosphatase: 95 U/L (ref 38–126)
Anion gap: 22 — ABNORMAL HIGH (ref 5–15)
BUN: 14 mg/dL (ref 6–20)
CO2: 13 mmol/L — ABNORMAL LOW (ref 22–32)
Calcium: 8.4 mg/dL — ABNORMAL LOW (ref 8.9–10.3)
Chloride: 90 mmol/L — ABNORMAL LOW (ref 98–111)
Creatinine, Ser: 1.04 mg/dL — ABNORMAL HIGH (ref 0.44–1.00)
GFR, Estimated: >60 mL/min (ref >60)
Glucose, Bld: 851 mg/dL (ref 70–99)
Potassium: 5 mmol/L (ref 3.5–5.1)
Sodium: 125 mmol/L — ABNORMAL LOW (ref 135–145)
Total Bilirubin: 2.3 mg/dL — ABNORMAL HIGH (ref 0.3–1.2)
Total Protein: 8.5 g/dL — ABNORMAL HIGH (ref 6.5–8.1)

## 2022-08-08 LAB — CBC WITH DIFFERENTIAL/PLATELET
Abs Immature Granulocytes: 0.05 10*3/uL (ref 0.00–0.07)
Basophils Absolute: 0.1 10*3/uL (ref 0.0–0.1)
Basophils Relative: 1 %
Eosinophils Absolute: 0 10*3/uL (ref 0.0–0.5)
Eosinophils Relative: 0 %
HCT: 36.8 % (ref 36.0–46.0)
Hemoglobin: 10.5 g/dL — ABNORMAL LOW (ref 12.0–15.0)
Immature Granulocytes: 1 %
Lymphocytes Relative: 12 %
Lymphs Abs: 0.9 10*3/uL (ref 0.7–4.0)
MCH: 22.7 pg — ABNORMAL LOW (ref 26.0–34.0)
MCHC: 28.5 g/dL — ABNORMAL LOW (ref 30.0–36.0)
MCV: 79.7 fL — ABNORMAL LOW (ref 80.0–100.0)
Monocytes Absolute: 0.4 10*3/uL (ref 0.1–1.0)
Monocytes Relative: 6 %
Neutro Abs: 5.5 10*3/uL (ref 1.7–7.7)
Neutrophils Relative %: 80 %
Platelets: 352 10*3/uL (ref 150–400)
RBC: 4.62 MIL/uL (ref 3.87–5.11)
RDW: 19.7 % — ABNORMAL HIGH (ref 11.5–15.5)
WBC: 6.8 10*3/uL (ref 4.0–10.5)
nRBC: 0 % (ref 0.0–0.2)

## 2022-08-08 LAB — BETA-HYDROXYBUTYRIC ACID
Beta-Hydroxybutyric Acid: 5.81 mmol/L — ABNORMAL HIGH (ref 0.05–0.27)
Beta-Hydroxybutyric Acid: 1.76 mmol/L — ABNORMAL HIGH (ref 0.05–0.27)
Beta-Hydroxybutyric Acid: 0.8 mmol/L — ABNORMAL HIGH (ref 0.05–0.27)

## 2022-08-08 LAB — BASIC METABOLIC PANEL
Anion gap: 16 — ABNORMAL HIGH (ref 5–15)
Anion gap: 10 (ref 5–15)
Anion gap: 4 — ABNORMAL LOW (ref 5–15)
BUN: 11 mg/dL (ref 6–20)
BUN: 9 mg/dL (ref 6–20)
BUN: 9 mg/dL (ref 6–20)
CO2: 13 mmol/L — ABNORMAL LOW (ref 22–32)
CO2: 19 mmol/L — ABNORMAL LOW (ref 22–32)
CO2: 21 mmol/L — ABNORMAL LOW (ref 22–32)
Calcium: 8.1 mg/dL — ABNORMAL LOW (ref 8.9–10.3)
Calcium: 7.9 mg/dL — ABNORMAL LOW (ref 8.9–10.3)
Calcium: 7.9 mg/dL — ABNORMAL LOW (ref 8.9–10.3)
Chloride: 103 mmol/L (ref 98–111)
Chloride: 103 mmol/L (ref 98–111)
Chloride: 106 mmol/L (ref 98–111)
Creatinine, Ser: 0.77 mg/dL (ref 0.44–1.00)
Creatinine, Ser: 0.54 mg/dL (ref 0.44–1.00)
Creatinine, Ser: 0.6 mg/dL (ref 0.44–1.00)
GFR, Estimated: >60 mL/min (ref >60)
GFR, Estimated: >60 mL/min (ref >60)
GFR, Estimated: >60 mL/min (ref >60)
Glucose, Bld: 312 mg/dL — ABNORMAL HIGH (ref 70–99)
Glucose, Bld: 178 mg/dL — ABNORMAL HIGH (ref 70–99)
Glucose, Bld: 233 mg/dL — ABNORMAL HIGH (ref 70–99)
Potassium: 4.3 mmol/L (ref 3.5–5.1)
Potassium: 4.1 mmol/L (ref 3.5–5.1)
Potassium: 3.6 mmol/L (ref 3.5–5.1)
Sodium: 132 mmol/L — ABNORMAL LOW (ref 135–145)
Sodium: 131 mmol/L — ABNORMAL LOW (ref 135–145)
Sodium: 131 mmol/L — ABNORMAL LOW (ref 135–145)

## 2022-08-08 LAB — BLOOD GAS, VENOUS
Acid-base deficit: 14.8 mmol/L — ABNORMAL HIGH (ref 0.0–2.0)
Acid-base deficit: 2.9 mmol/L — ABNORMAL HIGH (ref 0.0–2.0)
Bicarbonate: 12.8 mmol/L — ABNORMAL LOW (ref 20.0–28.0)
Bicarbonate: 22.6 mmol/L (ref 20.0–28.0)
O2 Saturation: 37.6 %
O2 Saturation: 69.6 %
Patient temperature: 37
Patient temperature: 37
pCO2, Ven: 35 mmHg — ABNORMAL LOW (ref 44–60)
pCO2, Ven: 41 mmHg — ABNORMAL LOW (ref 44–60)
pH, Ven: 7.17 — CL (ref 7.25–7.43)
pH, Ven: 7.35 (ref 7.25–7.43)
pO2, Ven: 31 mmHg — CL (ref 32–45)
pO2, Ven: 44 mmHg (ref 32–45)

## 2022-08-08 LAB — GLUCOSE, CAPILLARY
Glucose-Capillary: 244 mg/dL — ABNORMAL HIGH (ref 70–99)
Glucose-Capillary: 231 mg/dL — ABNORMAL HIGH (ref 70–99)
Glucose-Capillary: 163 mg/dL — ABNORMAL HIGH (ref 70–99)
Glucose-Capillary: 160 mg/dL — ABNORMAL HIGH (ref 70–99)
Glucose-Capillary: 147 mg/dL — ABNORMAL HIGH (ref 70–99)
Glucose-Capillary: 148 mg/dL — ABNORMAL HIGH (ref 70–99)
Glucose-Capillary: 244 mg/dL — ABNORMAL HIGH (ref 70–99)
Glucose-Capillary: 250 mg/dL — ABNORMAL HIGH (ref 70–99)
Glucose-Capillary: 207 mg/dL — ABNORMAL HIGH (ref 70–99)

## 2022-08-08 LAB — URINALYSIS, ROUTINE W REFLEX MICROSCOPIC
Bacteria, UA: NONE SEEN
Bilirubin Urine: NEGATIVE
Glucose, UA: >=500 mg/dL — AB
Hgb urine dipstick: NEGATIVE
Ketones, ur: 80 mg/dL — AB
Leukocytes,Ua: NEGATIVE
Nitrite: NEGATIVE
Protein, ur: NEGATIVE mg/dL
Specific Gravity, Urine: 1.027 (ref 1.005–1.030)
pH: 5 (ref 5.0–8.0)

## 2022-08-08 LAB — MRSA NEXT GEN BY PCR, NASAL: MRSA by PCR Next Gen: NOT DETECTED

## 2022-08-08 LAB — PREGNANCY, URINE: Preg Test, Ur: NEGATIVE

## 2022-08-08 LAB — CBG MONITORING, ED
Glucose-Capillary: >600 mg/dL (ref 70–99)
Glucose-Capillary: >600 mg/dL (ref 70–99)
Glucose-Capillary: 552 mg/dL (ref 70–99)
Glucose-Capillary: 382 mg/dL — ABNORMAL HIGH (ref 70–99)

## 2022-08-08 LAB — LACTIC ACID, PLASMA
Lactic Acid, Venous: 2.8 mmol/L (ref 0.5–1.9)
Lactic Acid, Venous: 1.5 mmol/L (ref 0.5–1.9)

## 2022-08-08 MED ORDER — LACTATED RINGERS IV SOLN: INTRAVENOUS | Status: DC

## 2022-08-08 MED ORDER — DEXTROSE 50 % IV SOLN: 0.0000 mL | INTRAVENOUS | Status: DC | PRN

## 2022-08-08 MED ORDER — POTASSIUM CHLORIDE 10 MEQ/100ML IV SOLN
10.0000 meq | INTRAVENOUS | Status: AC
  Filled 2022-08-08: qty 100

## 2022-08-08 MED ORDER — POTASSIUM CHLORIDE 10 MEQ/100ML IV SOLN
10.0000 meq | Freq: Once | INTRAVENOUS | Status: DC
  Administered 2022-08-08: 10 meq via INTRAVENOUS
  Filled 2022-08-08: qty 100

## 2022-08-08 MED ORDER — INFLUENZA VAC SPLIT QUAD 0.5 ML IM SUSY
0.5000 mL | PREFILLED_SYRINGE | INTRAMUSCULAR | Status: DC
  Filled 2022-08-08: qty 0.5

## 2022-08-08 MED ORDER — LACTATED RINGERS IV BOLUS: 20.0000 mL/kg | Freq: Once | INTRAVENOUS | Status: DC

## 2022-08-08 MED ORDER — CHLORHEXIDINE GLUCONATE CLOTH 2 % EX PADS
6.0000 | MEDICATED_PAD | Freq: Every day | CUTANEOUS | Status: DC
  Administered 2022-08-08: 6 via TOPICAL

## 2022-08-08 MED ORDER — ONDANSETRON HCL 4 MG/2ML IJ SOLN
4.0000 mg | Freq: Once | INTRAMUSCULAR | Status: AC
  Administered 2022-08-08: 4 mg via INTRAVENOUS
  Filled 2022-08-08: qty 2

## 2022-08-08 MED ORDER — INSULIN DETEMIR 100 UNIT/ML ~~LOC~~ SOLN
0.3000 [IU]/kg | SUBCUTANEOUS | Status: DC
  Administered 2022-08-08: 18 [IU] via SUBCUTANEOUS
  Filled 2022-08-08 (×2): qty 0.18

## 2022-08-08 MED ORDER — DEXTROSE IN LACTATED RINGERS 5 % IV SOLN: INTRAVENOUS | Status: DC

## 2022-08-08 MED ORDER — LACTATED RINGERS IV BOLUS
1000.0000 mL | Freq: Once | INTRAVENOUS | Status: AC
  Administered 2022-08-08: 1000 mL via INTRAVENOUS

## 2022-08-08 MED ORDER — INSULIN REGULAR(HUMAN) IN NACL 100-0.9 UT/100ML-% IV SOLN: INTRAVENOUS | Status: DC

## 2022-08-08 MED ORDER — ORAL CARE MOUTH RINSE: 15.0000 mL | OROMUCOSAL | Status: DC | PRN

## 2022-08-08 MED ORDER — SODIUM CHLORIDE 0.9 % IV BOLUS
1000.0000 mL | Freq: Once | INTRAVENOUS | Status: AC
  Administered 2022-08-08: 1000 mL via INTRAVENOUS

## 2022-08-08 MED ORDER — INSULIN ASPART 100 UNIT/ML IJ SOLN
4.0000 [IU] | Freq: Three times a day (TID) | INTRAMUSCULAR | Status: DC
  Administered 2022-08-09 (×2): 4 [IU] via SUBCUTANEOUS
  Filled 2022-08-08 (×2): qty 1

## 2022-08-08 MED ORDER — POTASSIUM CHLORIDE 10 MEQ/100ML IV SOLN
10.0000 meq | INTRAVENOUS | Status: DC
  Administered 2022-08-08: 10 meq via INTRAVENOUS

## 2022-08-08 MED ORDER — INSULIN ASPART 100 UNIT/ML IJ SOLN
0.0000 [IU] | Freq: Every day | INTRAMUSCULAR | Status: DC
  Administered 2022-08-08: 2 [IU] via SUBCUTANEOUS
  Filled 2022-08-08: qty 1

## 2022-08-08 MED ORDER — ENOXAPARIN SODIUM 40 MG/0.4ML IJ SOSY
40.0000 mg | PREFILLED_SYRINGE | INTRAMUSCULAR | Status: DC
  Filled 2022-08-08 (×2): qty 0.4

## 2022-08-08 MED ORDER — POTASSIUM CHLORIDE CRYS ER 20 MEQ PO TBCR
40.0000 meq | EXTENDED_RELEASE_TABLET | Freq: Once | ORAL | Status: AC
  Administered 2022-08-08: 40 meq via ORAL
  Filled 2022-08-08: qty 2

## 2022-08-08 MED ORDER — INSULIN ASPART 100 UNIT/ML IJ SOLN
0.0000 [IU] | Freq: Three times a day (TID) | INTRAMUSCULAR | Status: DC
  Administered 2022-08-09: 11 [IU] via SUBCUTANEOUS
  Administered 2022-08-09: 3 [IU] via SUBCUTANEOUS
  Filled 2022-08-08 (×2): qty 1

## 2022-08-08 MED ORDER — INSULIN REGULAR(HUMAN) IN NACL 100-0.9 UT/100ML-% IV SOLN
INTRAVENOUS | Status: DC
  Administered 2022-08-08: 18 [IU]/h via INTRAVENOUS
  Filled 2022-08-08: qty 100

## 2022-08-08 NOTE — Assessment & Plan Note (Signed)
Sodium 125 with a glucose a 51 Corrected sodium between 137 and 143

## 2022-08-08 NOTE — H&P (Signed)
History and Physical    Patient: Rebecca Callahan [id number] DOB: [birth year] DOA: 08/08/2022 DOS: the patient was seen and examined on 08/08/2022 PCP: Axel Filler, MD  Patient coming from: Home  Chief Complaint:  Chief Complaint  Patient presents with   Hyperglycemia   HPI: Rebecca Callahan is a 27 y.o. female with medical history significant of type 1 diabetes presenting with DKA.  Patient reports being out of her insulin approximately 4 to 5 days.  Patient states she ran out and has not seen a doctor in some time.  Was diagnosed with type 1 diabetes approximately 7 years ago after gestational diabetes during pregnancy.  Positive polyuria polydipsia with worsening dehydration nausea and vomiting.  No chest pain or shortness of breath.  No hemiparesis or confusion.  No fevers or chills.  No reported alcohol or tobacco use. Presented to the ER afebrile, heart rate in the low 100s, BP stable.  White count 6.8, hemoglobin 10.5, blood sugar initially in the 800s, now in the 300s.  Bicarb 13, creatinine 1.04, pH 7.17, bicarb of 12.8,.  Urinalysis and beta-hydroxybutyrate are pending. Review of Systems: As mentioned in the history of present illness. All other systems reviewed and are negative. Past Medical History:  Diagnosis Date   Diabetes mellitus without complication ([place])    History reviewed. No pertinent surgical history. Social History:  reports that she has never smoked. She has never used smokeless tobacco. She reports current alcohol use. She reports current drug use. Drug: Marijuana.  Allergies  Allergen Reactions   Amoxicillin Rash    History reviewed. No pertinent family history.  Prior to Admission medications   Medication Sig Start Date End Date Taking? Authorizing Provider  Insulin Glargine Solostar (LANTUS) 100 UNIT/ML Solostar Pen Inject 15 Units into the skin at bedtime.   Yes [provider]  insulin lispro (HUMALOG) 100 UNIT/ML KwikPen  Inject 1 Units into the skin 3 (three) times daily. Use your sliding scale 02/15/22   Merlyn Lot, MD  oxyCODONE-acetaminophen (PERCOCET/ROXICET) 5-325 MG tablet Take 1-2 tablets by mouth every 8 (eight) hours as needed for severe pain. Patient not taking: Reported on 08/08/2022 06/19/22   Teodoro Spray, PA    Physical Exam: Vitals:   08/08/22 1020 08/08/22 1123  BP:  111/82  Pulse: (!) 102 92  Resp:  20  Temp: 98.2 F (36.8 C)   TempSrc: Oral   SpO2: 100% 100%   Physical Exam Constitutional:      Comments: Underweight, dehydrated, fatigued  HENT:     Nose: Nose normal.     Mouth/Throat:     Mouth: Mucous membranes are dry.  Eyes:     Pupils: Pupils are equal, round, and reactive to light.  Cardiovascular:     Rate and Rhythm: Normal rate and regular rhythm.  Pulmonary:     Effort: Pulmonary effort is normal.  Abdominal:     General: Bowel sounds are normal.     Palpations: Abdomen is soft.  Musculoskeletal:        General: Normal range of motion.  Skin:    General: Skin is dry.  Neurological:     General: No focal deficit present.  Psychiatric:        Mood and Affect: Mood normal.     Data Reviewed:  There are no new results to review at this time. DG Hand Complete Right CLINICAL DATA:  Right hand pain after injury, slammed hand down onto wooden surface this  evening.  EXAM: RIGHT HAND - COMPLETE 3+ VIEW  COMPARISON:  None.  FINDINGS: Evaluation of the digits partially obscured due to positioning, patient reported being unable to separate her fingers. There is no evidence of fracture or dislocation. There is no evidence of arthropathy or other focal bone abnormality. Soft tissues are unremarkable.  IMPRESSION: Negative radiographs of the right hand allowing for limited assessment of the digits secondary to positioning.  Electronically Signed   By: Keith Rake M.D.   On: 04/17/2018 22:20  Lab Results  Component Value Date   WBC 6.8  08/08/2022   HGB 10.5 (L) 08/08/2022   HCT 36.8 08/08/2022   MCV 79.7 (L) 08/08/2022   PLT 352 [id number]   Last metabolic panel Lab Results  Component Value Date   GLUCOSE 851 (HH) 08/08/2022   NA 125 (L) 08/08/2022   K 5.0 08/08/2022   CL 90 (L) 08/08/2022   CO2 13 (L) 08/08/2022   BUN 14 08/08/2022   CREATININE 1.04 (H) 08/08/2022   GFRNONAA >60 08/08/2022   CALCIUM 8.4 (L) 08/08/2022   PROT 8.5 (H) 08/08/2022   ALBUMIN 3.9 08/08/2022   BILITOT 2.3 (H) 08/08/2022   ALKPHOS 95 08/08/2022   AST 29 08/08/2022   ALT 10 08/08/2022   ANIONGAP 22 (H) 08/08/2022    Assessment and Plan: * DKA (diabetic ketoacidosis) ([place]) Patient presented with blood sugars in 100s, bicarb 13, pH 7.17 Urinalysis and beta hydroxybutyrate are pending Patient markedly dry on exam-the clinically fairly reassuring Has ran out of insulin for the past 4 to 5 days Patient started on DKA protocol in ER Will continue Continue with aggressive IV fluid hydration    Metabolic acidosis pH [id number] with bicarb of 13 in setting of DKA Lactate pending Will continue with DKA protocol aggressive hydration Trend VBG Consider bicarb if there is recalcitrant acidosis Critical care consult as clinically indicated Follow  Pseudohyponatremia Sodium 125 with a glucose a 51 Corrected sodium between 137 and 143      Advance Care Planning:   Code Status: Full Code   Consults: None   Family Communication: No family at the bedside   Severity of Illness: The appropriate patient status for this patient is INPATIENT. Inpatient status is judged to be reasonable and necessary in order to provide the required intensity of service to ensure the patient's safety. The patient's presenting symptoms, physical exam findings, and initial radiographic and laboratory data in the context of their chronic comorbidities is felt to place them at high risk for further clinical deterioration. Furthermore, it is not anticipated that  the patient will be medically stable for discharge from the hospital within 2 midnights of admission.   * I certify that at the point of admission it is my clinical judgment that the patient will require inpatient hospital care spanning beyond 2 midnights from the point of admission due to high intensity of service, high risk for further deterioration and high frequency of surveillance required.*  Author: Deneise Lever, MD 08/08/2022 12:55 PM  For on call review [email or web address].

## 2022-08-08 NOTE — ED Provider Notes (Signed)
[facility] Provider Note    Event Date/Time   First MD Initiated Contact with Patient 08/08/22 1011     (approximate)   History   Hyperglycemia   HPI  Rebecca Callahan is a 27 y.o. female   Past medical history of Rebecca Callahan who presents emergency department with polyuria polydipsia and high blood sugar in the setting of not having access to her insulin over the weekend.  She did not get a refill and was unable to reach her provider for refill.  She feels nauseated.  She has no fever.  She denies abdominal pain.  She has no respiratory infectious symptoms.  Independent Historian contributed to assessment above: Family member at bedside  External Medical Documents Reviewed: Outpatient office visit in March 2023 with her primary doctor for migraine, with a review of past medical history including type I diabetic      Physical Exam   Triage Vital Signs: ED Triage Vitals  Enc Vitals Group     BP      Pulse      Resp      Temp      Temp src      SpO2      Weight      Height      Head Circumference      Peak Flow      Pain Score      Pain Loc      Pain Edu?      Excl. in [place]?     Most recent vital signs: Vitals:   08/08/22 1020 08/08/22 1123  BP:  111/82  Pulse: (!) 102 92  Resp:  20  Temp: 98.2 F (36.8 C)   SpO2: 100% 100%    General: Awake, no distress.  CV:  Mucous membranes dry and poor skin turgor looks clinically dehydrated Resp:  Normal effort.  Breathing comfortably normal respiratory rate Abd:  No distention.  Soft and nontender Other:  Overall comfortable albeit dehydrated soft nontender abdomen lungs clear vital signs normal   ED Results / Procedures / Treatments   Labs (all labs ordered are listed, but only abnormal results are displayed) Labs Reviewed  COMPREHENSIVE METABOLIC PANEL - Abnormal; Notable for the following components:      Result Value   Sodium 125 (*)    Chloride 90 (*)    CO2 13 (*)     Glucose, Bld 851 (*)    Creatinine, Ser 1.04 (*)    Calcium 8.4 (*)    Total Protein 8.5 (*)    Total Bilirubin 2.3 (*)    Anion gap 22 (*)    All other components within normal limits  BLOOD GAS, VENOUS - Abnormal; Notable for the following components:   pH, Ven 7.17 (*)    pCO2, Ven 35 (*)    pO2, Ven 31 (*)    Bicarbonate 12.8 (*)    Acid-base deficit 14.8 (*)    All other components within normal limits  CBC WITH DIFFERENTIAL/PLATELET - Abnormal; Notable for the following components:   Hemoglobin 10.5 (*)    MCV 79.7 (*)    MCH 22.7 (*)    MCHC 28.5 (*)    RDW 19.7 (*)    All other components within normal limits  CBG MONITORING, ED - Abnormal; Notable for the following components:   Glucose-Capillary >600 (*)    All other components within normal limits  PREGNANCY, URINE  BASIC METABOLIC PANEL  BASIC  METABOLIC PANEL  BASIC METABOLIC PANEL  BASIC METABOLIC PANEL  BETA-HYDROXYBUTYRIC ACID  BETA-HYDROXYBUTYRIC ACID  URINALYSIS, ROUTINE W REFLEX MICROSCOPIC  POC URINE PREG, ED     I ordered and reviewed the above labs they are notable for pH is 7.17 and she has a markedly elevated glucose in the 800s and an anion gap of 22 potassium is 5     Critical Care performed: Yes, see critical care procedure note(s)  .Critical Care  Performed by: Lucillie Garfinkel, MD Authorized by: Lucillie Garfinkel, MD   Critical care provider statement:    Critical care time (minutes):  30   Critical care was time spent personally by me on the following activities:  Development of treatment plan with patient or surrogate, discussions with consultants, evaluation of patient's response to treatment, examination of patient, ordering and review of laboratory studies, ordering and review of radiographic studies, ordering and performing treatments and interventions, pulse oximetry, re-evaluation of patient's condition and review of old charts    [facility] ED: Medications  lactated ringers  bolus 20 mL/kg (has no administration in time range)  insulin regular, human (MYXREDLIN) 100 units/ 100 mL infusion (has no administration in time range)  lactated ringers infusion (has no administration in time range)  dextrose 5 % in lactated ringers infusion (has no administration in time range)  dextrose 50 % solution 0-50 mL (has no administration in time range)  potassium chloride 10 mEq in 100 mL IVPB (has no administration in time range)  sodium chloride 0.9 % bolus 1,000 mL (0 mLs Intravenous Stopped 08/08/22 1121)  sodium chloride 0.9 % bolus 1,000 mL (1,000 mLs Intravenous Bolus 08/08/22 1121)    External physician / consultants:  I spoke with hospitalist for admission and regarding care plan for this patient.   IMPRESSION / MDM / ASSESSMENT AND PLAN / ED COURSE  I reviewed the triage vital signs and the nursing notes.                                Patient's presentation is most consistent with acute presentation with potential threat to life or bodily function.  Differential diagnosis includes, but is not limited to, DKA hyperglycemia infection   The patient is on the cardiac monitor to evaluate for evidence of arrhythmia and/or significant heart rate changes.  MDM: Patient with evidence of DKA.  Inciting event is lack of insulin.  Started on DKA treatment with Endo tool.  Rehydrate with 2 L crystalloid.    Vital signs normal patient mentation is very well and looks comfortable.  Admission.         FINAL CLINICAL IMPRESSION(S) / ED DIAGNOSES   Final diagnoses:  Type 1 diabetes mellitus with ketoacidosis without coma ([place])     Rx / DC Orders   ED Discharge Orders     None        Note:  This document was prepared using Dragon voice recognition software and may include unintentional dictation errors.    Lucillie Garfinkel, MD 08/08/22 [phone]

## 2022-08-08 NOTE — ED Notes (Signed)
Report to Nathan, RN.

## 2022-08-08 NOTE — Assessment & Plan Note (Addendum)
Patient presented with blood sugars in 100s, bicarb 13, pH 7.17 Urinalysis and beta hydroxybutyrate are pending Patient markedly dry on exam-the clinically fairly reassuring Has ran out of insulin for the past 4 to 5 days Patient started on DKA protocol in ER Will continue Continue with aggressive IV fluid hydration

## 2022-08-08 NOTE — Assessment & Plan Note (Addendum)
pH 7.17 with bicarb of 13 in setting of DKA Lactate pending Will continue with DKA protocol aggressive hydration Trend VBG Consider bicarb if there is recalcitrant acidosis Critical care consult as clinically indicated Follow

## 2022-08-08 NOTE — Progress Notes (Signed)
[facility] for Electrolyte Monitoring and Replacement   Recent Labs: Potassium (mmol/L)  Date Value  08/08/2022 5.0  07/26/2013 3.8   Calcium (mg/dL)  Date Value  08/08/2022 8.4 (L)   Calcium, Total (mg/dL)  Date Value  07/26/2013 8.1 (L)   Albumin (g/dL)  Date Value  08/08/2022 3.9  07/26/2013 3.3 (L)   Sodium (mmol/L)  Date Value  08/08/2022 125 (L)  07/26/2013 137    Assessment: 27 y.o. female with medical history significant of type 1 diabetes presenting with DKA.   Goal of Therapy:  Electrolytes WNL  Plan:  ---10 mEq IV KCl per MD order ---BMP q4h while on IV insulin  Dallie Piles ,PharmD Clinical Pharmacist 08/08/2022 1:06 PM

## 2022-08-08 NOTE — ED Triage Notes (Signed)
Pt presents to ED via [facility] with c/o of hyperglycemia due to being out of insulin since Saturday. Pt does endorse N/V since Saturday as well. NAD noted.   CBG: [place]

## 2022-08-08 NOTE — Progress Notes (Signed)
[facility] for Electrolyte Monitoring and Replacement   Recent Labs: Potassium (mmol/L)  Date Value  08/08/2022 4.1  07/26/2013 3.8   Calcium (mg/dL)  Date Value  08/08/2022 7.9 (L)   Calcium, Total (mg/dL)  Date Value  07/26/2013 8.1 (L)   Albumin (g/dL)  Date Value  08/08/2022 3.9  07/26/2013 3.3 (L)   Sodium (mmol/L)  Date Value  08/08/2022 131 (L)  07/26/2013 137    Assessment: 27 y.o. female with medical history significant of type 1 diabetes presenting with DKA  Goal of Therapy:  Electrolytes within normal limits Attempt to keep K+ > 4 while on insulin gtt  Plan:  --No electrolyte replacement indicated at this time --BMP q4h while on IV insulin; check Mg & Phos tomorrow AM  Rebecca Callahan 08/08/2022 6:43 PM

## 2022-08-09 ENCOUNTER — Other Ambulatory Visit: Payer: Self-pay

## 2022-08-09 DIAGNOSIS — E1010 Type 1 diabetes mellitus with ketoacidosis without coma: Principal | ICD-10-CM

## 2022-08-09 DIAGNOSIS — E440 Moderate protein-calorie malnutrition: Secondary | ICD-10-CM

## 2022-08-09 DIAGNOSIS — E872 Acidosis, unspecified: Secondary | ICD-10-CM

## 2022-08-09 DIAGNOSIS — R7989 Other specified abnormal findings of blood chemistry: Secondary | ICD-10-CM

## 2022-08-09 LAB — BASIC METABOLIC PANEL
Anion gap: 5 (ref 5–15)
Anion gap: 2 — ABNORMAL LOW (ref 5–15)
BUN: 10 mg/dL (ref 6–20)
BUN: 10 mg/dL (ref 6–20)
CO2: 21 mmol/L — ABNORMAL LOW (ref 22–32)
CO2: 22 mmol/L (ref 22–32)
Calcium: 7.9 mg/dL — ABNORMAL LOW (ref 8.9–10.3)
Calcium: 7.8 mg/dL — ABNORMAL LOW (ref 8.9–10.3)
Chloride: 103 mmol/L (ref 98–111)
Chloride: 106 mmol/L (ref 98–111)
Creatinine, Ser: 0.57 mg/dL (ref 0.44–1.00)
Creatinine, Ser: 0.51 mg/dL (ref 0.44–1.00)
GFR, Estimated: >60 mL/min (ref >60)
GFR, Estimated: >60 mL/min (ref >60)
Glucose, Bld: 289 mg/dL — ABNORMAL HIGH (ref 70–99)
Glucose, Bld: 231 mg/dL — ABNORMAL HIGH (ref 70–99)
Potassium: 3.8 mmol/L (ref 3.5–5.1)
Potassium: 3.7 mmol/L (ref 3.5–5.1)
Sodium: 129 mmol/L — ABNORMAL LOW (ref 135–145)
Sodium: 130 mmol/L — ABNORMAL LOW (ref 135–145)

## 2022-08-09 LAB — PHOSPHORUS: Phosphorus: 1.9 mg/dL — ABNORMAL LOW (ref 2.5–4.6)

## 2022-08-09 LAB — GLUCOSE, CAPILLARY
Glucose-Capillary: 168 mg/dL — ABNORMAL HIGH (ref 70–99)
Glucose-Capillary: 322 mg/dL — ABNORMAL HIGH (ref 70–99)

## 2022-08-09 LAB — BETA-HYDROXYBUTYRIC ACID
Beta-Hydroxybutyric Acid: 1.56 mmol/L — ABNORMAL HIGH (ref 0.05–0.27)
Beta-Hydroxybutyric Acid: 1.29 mmol/L — ABNORMAL HIGH (ref 0.05–0.27)

## 2022-08-09 LAB — HEMOGLOBIN A1C
Hgb A1c MFr Bld: 15.1 % — ABNORMAL HIGH (ref 4.8–5.6)
Mean Plasma Glucose: 387 mg/dL

## 2022-08-09 LAB — MAGNESIUM: Magnesium: 1.6 mg/dL — ABNORMAL LOW (ref 1.7–2.4)

## 2022-08-09 MED ORDER — INSULIN LISPRO PROT & LISPRO (75-25 MIX) 100 UNIT/ML KWIKPEN
14.0000 [IU] | PEN_INJECTOR | Freq: Two times a day (BID) | SUBCUTANEOUS | 11 refills | Status: DC
  Filled 2022-08-09: qty 15, 54d supply, fill #0

## 2022-08-09 MED ORDER — BLOOD GLUCOSE MONITORING SUPPL DEVI
1.0000 | Freq: Three times a day (TID) | 0 refills | Status: DC
  Filled 2022-08-09: qty 1, fill #0

## 2022-08-09 MED ORDER — MAGNESIUM SULFATE 2 GM/50ML IV SOLN
2.0000 g | Freq: Once | INTRAVENOUS | Status: AC
  Administered 2022-08-09: 2 g via INTRAVENOUS
  Filled 2022-08-09: qty 50

## 2022-08-09 MED ORDER — BLOOD GLUCOSE TEST VI STRP: 1.0000 | ORAL_STRIP | Freq: Three times a day (TID) | 0 refills | Status: AC

## 2022-08-09 MED ORDER — BLOOD GLUCOSE MONITORING SUPPL DEVI: 1.0000 | Freq: Three times a day (TID) | 1 refills | Status: AC

## 2022-08-09 MED ORDER — INSULIN PEN NEEDLE 32G X 4 MM MISC: 11 refills | Status: DC

## 2022-08-09 MED ORDER — ACETAMINOPHEN 325 MG PO TABS
650.0000 mg | ORAL_TABLET | Freq: Four times a day (QID) | ORAL | Status: DC | PRN
  Administered 2022-08-09: 650 mg via ORAL
  Filled 2022-08-09: qty 2

## 2022-08-09 MED ORDER — LANCET DEVICE MISC
1.0000 | Freq: Three times a day (TID) | 0 refills | Status: DC
  Filled 2022-08-09: qty 1, 30d supply, fill #0

## 2022-08-09 MED ORDER — BLOOD GLUCOSE TEST VI STRP
1.0000 | ORAL_STRIP | Freq: Three times a day (TID) | 0 refills | Status: DC
  Filled 2022-08-09: qty 100, 34d supply, fill #0

## 2022-08-09 MED ORDER — INFLUENZA VAC SPLIT QUAD 0.5 ML IM SUSY
0.5000 mL | PREFILLED_SYRINGE | Freq: Once | INTRAMUSCULAR | Status: AC
  Administered 2022-08-09: 0.5 mL via INTRAMUSCULAR
  Filled 2022-08-09: qty 0.5

## 2022-08-09 MED ORDER — K PHOS MONO-SOD PHOS DI & MONO 155-852-130 MG PO TABS
500.0000 mg | ORAL_TABLET | Freq: Once | ORAL | Status: AC
  Administered 2022-08-09: 500 mg via ORAL
  Filled 2022-08-09: qty 2

## 2022-08-09 MED ORDER — LANCET DEVICE MISC: 1.0000 | Freq: Three times a day (TID) | 0 refills | Status: AC

## 2022-08-09 MED ORDER — LANCETS MISC. MISC
1.0000 | Freq: Three times a day (TID) | 0 refills | Status: AC
  Filled 2022-08-09: qty 100, 30d supply, fill #0

## 2022-08-09 MED ORDER — INSULIN PEN NEEDLE 32G X 4 MM MISC
11 refills | Status: AC
  Filled 2022-08-09: qty 100, 50d supply, fill #0

## 2022-08-09 MED ORDER — INFLUENZA VAC SPLIT QUAD 0.5 ML IM SUSY: 0.5000 mL | PREFILLED_SYRINGE | INTRAMUSCULAR | Status: DC

## 2022-08-09 NOTE — Progress Notes (Signed)
Initial Nutrition Assessment  DOCUMENTATION CODES:   Non-severe (moderate) malnutrition in context of chronic illness  INTERVENTION:   Ensure Max protein supplement BID, each supplement provides 150kcal and 30g of protein.  MVI po daily   Pt at high refeed risk; recommend monitor potassium, magnesium and phosphorus labs daily until stable  NUTRITION DIAGNOSIS:   Moderate Malnutrition related to chronic illness (uncontrolled DM) as evidenced by moderate fat depletion, moderate muscle depletion.  GOAL:   Patient will meet greater than or equal to 90% of their needs  MONITOR:   PO intake, Supplement acceptance, Labs, Weight trends, I & O's, Skin  REASON FOR ASSESSMENT:   Malnutrition Screening Tool    ASSESSMENT:   27 y/o female with h/o type I DM and marijuana use who is admitted with DKA.  Met with pt in room today. Pt sleeping at time of RD visit and is hard to arouse. Pt  reports eating 100% of breakfast this morning. RD discussed with pt the importance of adequate nutrition needed to preserve lean muscle. Pt is willing to drink chocolate Ensure in hospital. RD will add supplements and MVI to help pt meet her estimated needs. Pt is currently refeeding. Per chart, pt is down 11lbs(8%) since January; this is significant weight loss.   Medications reviewed and include: lovenox, insulin  Labs reviewed: Na 130(L), K 3.7 wnl, P 1.9(L), Mg 1.6(L) Hgb 10.5(L) Cbgs- 322, 168 x 24 hrs  AIC 15.1(H)- 3/19  NUTRITION - FOCUSED PHYSICAL EXAM:  Flowsheet Row Most Recent Value  Orbital Region No depletion  Upper Arm Region Moderate depletion  Thoracic and Lumbar Region Mild depletion  Buccal Region No depletion  Temple Region No depletion  Clavicle Bone Region Moderate depletion  Clavicle and Acromion Bone Region Moderate depletion  Scapular Bone Region Moderate depletion  Dorsal Hand Mild depletion  Patellar Region Moderate depletion  Anterior Thigh Region Moderate  depletion  Posterior Calf Region Moderate depletion  Edema (RD Assessment) None  Hair Reviewed  Eyes Reviewed  Mouth Reviewed  Skin Reviewed  Nails Reviewed   Diet Order:   Diet Order             Diet heart healthy/carb modified Room service appropriate? Yes; Fluid consistency: Thin  Diet effective now                  EDUCATION NEEDS:   Education needs have been addressed  Skin:  Skin Assessment: Reviewed RN Assessment  Last BM:  3/18  Height:   Ht Readings from Last 1 Encounters:  08/08/22 5\' 8"  (1.727 m)    Weight:   Wt Readings from Last 1 Encounters:  08/08/22 58.6 kg    Ideal Body Weight:  63.6 kg  BMI:  Body mass index is 19.64 kg/m.  Estimated Nutritional Needs:   Kcal:  1800-2100kcal/day  Protein:  90-105g/day  Fluid:  1.8-2.1L/day  Koleen Distance MS, RD, LDN Please refer to [facility] for RD and/or RD on-call/weekend/after hours pager

## 2022-08-09 NOTE — Plan of Care (Signed)
Problem: Education: Goal: Ability to describe self-care measures that may prevent or decrease complications (Diabetes Survival Skills Education) will improve 08/09/2022 1558 by Ginnie Smart, RN Outcome: Adequate for Discharge 08/09/2022 0840 by Ginnie Smart, RN Outcome: Not Progressing Goal: Individualized Educational Video(s) 08/09/2022 1558 by Ginnie Smart, RN Outcome: Adequate for Discharge 08/09/2022 0840 by Ginnie Smart, RN Outcome: Not Progressing   Problem: Coping: Goal: Ability to adjust to condition or change in health will improve 08/09/2022 1558 by Ginnie Smart, RN Outcome: Adequate for Discharge 08/09/2022 0840 by Ginnie Smart, RN Outcome: Not Progressing   Problem: Fluid Volume: Goal: Ability to maintain a balanced intake and output will improve 08/09/2022 1558 by Ginnie Smart, RN Outcome: Adequate for Discharge 08/09/2022 0840 by Ginnie Smart, RN Outcome: Not Progressing   Problem: Health Behavior/Discharge Planning: Goal: Ability to identify and utilize available resources and services will improve 08/09/2022 1558 by Ginnie Smart, RN Outcome: Adequate for Discharge 08/09/2022 0840 by Ginnie Smart, RN Outcome: Not Progressing Goal: Ability to manage health-related needs will improve 08/09/2022 1558 by Ginnie Smart, RN Outcome: Adequate for Discharge 08/09/2022 0840 by Ginnie Smart, RN Outcome: Not Progressing   Problem: Metabolic: Goal: Ability to maintain appropriate glucose levels will improve 08/09/2022 1558 by Ginnie Smart, RN Outcome: Adequate for Discharge 08/09/2022 0840 by Ginnie Smart, RN Outcome: Not Progressing   Problem: Nutritional: Goal: Maintenance of adequate nutrition will improve 08/09/2022 1558 by Ginnie Smart, RN Outcome: Adequate for Discharge 08/09/2022 0840 by Ginnie Smart, RN Outcome: Not Progressing Goal: Progress toward achieving an optimal weight will improve 08/09/2022 1558 by Ginnie Smart, RN Outcome: Adequate for Discharge 08/09/2022 0840 by Ginnie Smart, RN Outcome: Not Progressing   Problem: Skin Integrity: Goal: Risk for impaired skin integrity will decrease 08/09/2022 1558 by Ginnie Smart, RN Outcome: Adequate for Discharge 08/09/2022 0840 by Ginnie Smart, RN Outcome: Not Progressing   Problem: Tissue Perfusion: Goal: Adequacy of tissue perfusion will improve 08/09/2022 1558 by Ginnie Smart, RN Outcome: Adequate for Discharge 08/09/2022 0840 by Ginnie Smart, RN Outcome: Not Progressing   Problem: Education: Goal: Ability to describe self-care measures that may prevent or decrease complications (Diabetes Survival Skills Education) will improve 08/09/2022 1558 by Ginnie Smart, RN Outcome: Adequate for Discharge 08/09/2022 0840 by Ginnie Smart, RN Outcome: Not Progressing Goal: Individualized Educational Video(s) 08/09/2022 1558 by Ginnie Smart, RN Outcome: Adequate for Discharge 08/09/2022 0840 by Ginnie Smart, RN Outcome: Not Progressing   Problem: Cardiac: Goal: Ability to maintain an adequate cardiac output will improve 08/09/2022 1558 by Ginnie Smart, RN Outcome: Adequate for Discharge 08/09/2022 0840 by Ginnie Smart, RN Outcome: Not Progressing   Problem: Health Behavior/Discharge Planning: Goal: Ability to identify and utilize available resources and services will improve 08/09/2022 1558 by Ginnie Smart, RN Outcome: Adequate for Discharge 08/09/2022 0840 by Ginnie Smart, RN Outcome: Not Progressing Goal: Ability to manage health-related needs will improve 08/09/2022 1558 by Ginnie Smart, RN Outcome: Adequate for Discharge 08/09/2022 0840 by Ginnie Smart, RN Outcome: Not Progressing   Problem: Fluid Volume: Goal: Ability to achieve a balanced intake and output will improve 08/09/2022 1558 by Ginnie Smart, RN Outcome: Adequate for Discharge 08/09/2022 0840 by Ginnie Smart, RN Outcome: Not Progressing    Problem: Metabolic: Goal: Ability to maintain appropriate glucose levels will improve 08/09/2022 1558 by Ginnie Smart,  RN Outcome: Adequate for Discharge 08/09/2022 0840 by Ginnie Smart, RN Outcome: Not Progressing   Problem: Nutritional: Goal: Maintenance of adequate nutrition will improve 08/09/2022 1558 by Ginnie Smart, RN Outcome: Adequate for Discharge 08/09/2022 0840 by Ginnie Smart, RN Outcome: Not Progressing Goal: Maintenance of adequate weight for body size and type will improve 08/09/2022 1558 by Ginnie Smart, RN Outcome: Adequate for Discharge 08/09/2022 0840 by Ginnie Smart, RN Outcome: Not Progressing   Problem: Respiratory: Goal: Will regain and/or maintain adequate ventilation 08/09/2022 1558 by Ginnie Smart, RN Outcome: Adequate for Discharge 08/09/2022 0840 by Ginnie Smart, RN Outcome: Not Progressing   Problem: Urinary Elimination: Goal: Ability to achieve and maintain adequate renal perfusion and functioning will improve 08/09/2022 1558 by Ginnie Smart, RN Outcome: Adequate for Discharge 08/09/2022 0840 by Ginnie Smart, RN Outcome: Not Progressing   Problem: Education: Goal: Knowledge of General Education information will improve Description: Including pain rating scale, medication(s)/side effects and non-pharmacologic comfort measures 08/09/2022 1558 by Ginnie Smart, RN Outcome: Adequate for Discharge 08/09/2022 0840 by Ginnie Smart, RN Outcome: Not Progressing   Problem: Health Behavior/Discharge Planning: Goal: Ability to manage health-related needs will improve 08/09/2022 1558 by Ginnie Smart, RN Outcome: Adequate for Discharge 08/09/2022 0840 by Ginnie Smart, RN Outcome: Not Progressing   Problem: Clinical Measurements: Goal: Ability to maintain clinical measurements within normal limits will improve 08/09/2022 1558 by Ginnie Smart, RN Outcome: Adequate for Discharge 08/09/2022 0840 by Ginnie Smart,  RN Outcome: Not Progressing Goal: Will remain free from infection 08/09/2022 1558 by Ginnie Smart, RN Outcome: Adequate for Discharge 08/09/2022 0840 by Ginnie Smart, RN Outcome: Not Progressing Goal: Diagnostic test results will improve 08/09/2022 1558 by Ginnie Smart, RN Outcome: Adequate for Discharge 08/09/2022 0840 by Ginnie Smart, RN Outcome: Not Progressing Goal: Respiratory complications will improve 08/09/2022 1558 by Ginnie Smart, RN Outcome: Adequate for Discharge 08/09/2022 0840 by Ginnie Smart, RN Outcome: Not Progressing Goal: Cardiovascular complication will be avoided 08/09/2022 1558 by Ginnie Smart, RN Outcome: Adequate for Discharge 08/09/2022 0840 by Ginnie Smart, RN Outcome: Not Progressing   Problem: Activity: Goal: Risk for activity intolerance will decrease 08/09/2022 1558 by Ginnie Smart, RN Outcome: Adequate for Discharge 08/09/2022 0840 by Ginnie Smart, RN Outcome: Not Progressing   Problem: Nutrition: Goal: Adequate nutrition will be maintained 08/09/2022 1558 by Ginnie Smart, RN Outcome: Adequate for Discharge 08/09/2022 0840 by Ginnie Smart, RN Outcome: Not Progressing   Problem: Coping: Goal: Level of anxiety will decrease 08/09/2022 1558 by Ginnie Smart, RN Outcome: Adequate for Discharge 08/09/2022 0840 by Ginnie Smart, RN Outcome: Not Progressing   Problem: Elimination: Goal: Will not experience complications related to bowel motility 08/09/2022 1558 by Ginnie Smart, RN Outcome: Adequate for Discharge 08/09/2022 0840 by Ginnie Smart, RN Outcome: Not Progressing Goal: Will not experience complications related to urinary retention 08/09/2022 1558 by Ginnie Smart, RN Outcome: Adequate for Discharge 08/09/2022 0840 by Ginnie Smart, RN Outcome: Not Progressing   Problem: Pain Managment: Goal: General experience of comfort will improve 08/09/2022 1558 by Ginnie Smart, RN Outcome: Adequate for  Discharge 08/09/2022 0840 by Ginnie Smart, RN Outcome: Not Progressing   Problem: Safety: Goal: Ability to remain free from injury will improve 08/09/2022 1558 by Ginnie Smart, RN Outcome: Adequate for Discharge 08/09/2022 0840 by Ginnie Smart, RN Outcome: Not  Progressing   Problem: Skin Integrity: Goal: Risk for impaired skin integrity will decrease 08/09/2022 1558 by Ginnie Smart, RN Outcome: Adequate for Discharge 08/09/2022 0840 by Ginnie Smart, RN Outcome: Not Progressing

## 2022-08-09 NOTE — Discharge Summary (Addendum)
Physician Discharge Summary   Patient: Rebecca Callahan MRN: [id number] DOB: 09-Jun-1995  Admit date:     08/08/2022  Discharge date: 08/09/22  Discharge Physician: Fritzi Mandes   PCP: Axel Filler, MD   Recommendations at discharge:   patient to [facility] at least 2 to 3 times a day and review with PCP  Discharge Diagnoses: Principal Problem:   DKA (diabetic ketoacidosis) ([place]) Active Problems:   Metabolic acidosis   Pseudohyponatremia  Rebecca Callahan is a 27 y.o. female with medical history significant of type 1 diabetes presenting with DKA.  Patient reports being out of her insulin approximately 4 to 5 days.  Patient states she ran out and has not seen a doctor in some time.  Was diagnosed with type 1 diabetes approximately 7 years ago after gestational diabetes during pregnanc   DKA (diabetic ketoacidosis) ([place]) --Patient presented with blood sugars in 100s, bicarb 13, pH 7.17 --Has ran out of insulin for the past 4 to 5 days --Patient started on DKA protocol I --DKA resolved -- diabetes coordinator recommends insulin 75 2514 units BID. [facility] -- [facility] and [facility].   Metabolic acidosis pH [id number] with bicarb of 13 in setting of DKA -- resolved   Pseudohyponatremia Sodium 125 with a glucose a 51 Corrected sodium between 137 and 143  Low magnesium, low phosphorus-- repleted  Nutrition Status: Nutrition Problem: Moderate Malnutrition Etiology: chronic illness (uncontrolled DM) Signs/Symptoms: moderate fat depletion, moderate muscle depletion     Overall hemodynamically stable. Patient advised [facility], check sugars regularly and discuss with PCP. She will discharge to home       Disposition: Home Diet recommendation:  Carb modified diet DISCHARGE MEDICATION: Allergies as of 08/09/2022        Reactions   Amoxicillin Rash        Medication List     STOP taking these medications    Insulin Glargine Solostar 100 UNIT/ML Solostar Pen Commonly known as: LANTUS   insulin lispro 100 UNIT/ML KwikPen Commonly known as: HUMALOG       TAKE these medications    Blood Glucose Monitoring Suppl Devi 1 each by Does not apply route in the morning, at noon, and at bedtime. May substitute to any manufacturer covered by patient's insurance.   BLOOD GLUCOSE TEST STRIPS Strp 1 each by In Vitro route in the morning, at noon, and at bedtime. May substitute to any manufacturer covered by patient's insurance.   insulin lispro protamine-lispro (75-25) 100 UNIT/ML Susp injection Commonly known as: HUMALOG 75/25 MIX Inject 14 Units into the skin 2 (two) times daily with a meal.   Lancet Device Misc 1 each by Does not apply route in the morning, at noon, and at bedtime. May substitute to any manufacturer covered by patient's insurance.   Lancets Misc. Misc 1 each by Does not apply route in the morning, at noon, and at bedtime. May substitute to any manufacturer covered by patient's insurance.        Follow-up Information     [facility] [place]. Schedule an appointment as soon as possible for a visit in 1 week(s).   Specialty: Primary Care Why: establish PCP Contact information: [address] [place] [phone]               [place] Weights   08/08/22  1329  Weight: 58.6 kg     Condition at discharge: fair  The results of significant diagnostics from this hospitalization (including imaging, microbiology, ancillary and laboratory) are listed below for reference.   Imaging Studies: No results found.  Microbiology: Results for orders placed or performed during the hospital encounter of 08/08/22  MRSA Next Gen by PCR, Nasal     Status: None   Collection Time: 08/08/22  1:44 PM   Specimen: Nasal Mucosa; Nasal  Swab  Result Value Ref Range Status   MRSA by PCR Next Gen NOT DETECTED NOT DETECTED Final    Comment: (NOTE) The GeneXpert MRSA Assay (FDA approved for NASAL specimens only), is one component of a comprehensive MRSA colonization surveillance program. It is not intended to diagnose MRSA infection nor to guide or monitor treatment for MRSA infections. Test performance is not FDA approved in patients less than 32 years old. Performed at [facility], [address] [place], [place] [address]     Labs: CBC: Recent Labs  Lab 08/08/22 1016  WBC 6.8  NEUTROABS 5.5  HGB 10.5*  HCT 36.8  MCV 79.7*  PLT [id number]   Basic Metabolic Panel: Recent Labs  Lab 08/08/22 1344 08/08/22 1743 08/08/22 2222 08/09/22 0228 08/09/22 0422 08/09/22 0424  NA 132* 131* 131* 129* 130*  --   K 4.3 4.1 3.6 3.8 3.7  --   CL 103 103 106 103 106  --   CO2 13* 19* 21* 21* 22  --   GLUCOSE 312* 178* 233* 289* 231*  --   BUN 11 9 9 10 10   --   CREATININE 0.77 0.54 0.60 0.57 0.51  --   CALCIUM 8.1* 7.9* 7.9* 7.9* 7.8*  --   MG  --   --   --   --   --  1.6*  PHOS  --   --   --   --   --  1.9*   Liver Function Tests: Recent Labs  Lab 08/08/22 1016  AST 29  ALT 10  ALKPHOS 95  BILITOT 2.3*  PROT 8.5*  ALBUMIN 3.9   CBG: Recent Labs  Lab 08/08/22 2112 08/08/22 2215 08/08/22 2250 08/09/22 0723 08/09/22 1133  GLUCAP 244* 250* 207* 168* 322*    Discharge time spent: greater than 30 minutes.  Signed: Fritzi Mandes, MD [facility] 08/09/2022

## 2022-08-09 NOTE — Progress Notes (Signed)
[place] for Electrolyte Monitoring and Replacement   Recent Labs: Potassium (mmol/L)  Date Value  08/09/2022 3.7  07/26/2013 3.8   Magnesium (mg/dL)  Date Value  08/09/2022 1.6 (L)   Calcium (mg/dL)  Date Value  08/09/2022 7.8 (L)   Calcium, Total (mg/dL)  Date Value  07/26/2013 8.1 (L)   Albumin (g/dL)  Date Value  08/08/2022 3.9  07/26/2013 3.3 (L)   Phosphorus (mg/dL)  Date Value  08/09/2022 1.9 (L)   Sodium (mmol/L)  Date Value  08/09/2022 130 (L)  07/26/2013 137     Assessment: 27 y.o. female with medical history significant of type 1 diabetes presenting with DKA   Goal of Therapy:  Electrolytes WNL  Plan:  2 grams IV magnesium sulfate x 1 Phosphorous 500 mg po x 1 as K-phos neutral (contains phosphorus 16 mMol, potassium 2.2 mEq) Recheck electrolytes in am  Dallie Piles ,PharmD Clinical Pharmacist 08/09/2022 10:10 AM

## 2022-08-09 NOTE — TOC Transition Note (Signed)
Transition of Care [facility]) - CM/SW Discharge Note   Patient Details  Name: Rebecca Callahan MRN: [id number] Date of Birth: [birth year]  [facility] CM/SW Contact:  Tiburcio Bash, LCSW Phone Number: 08/09/2022, 2:14 PM   Clinical Narrative:     Patient to discharge home today, meter for diabetes presented at bedside from [place] program (#26). Patient signed form stating received. Patient aware meds to be delivered to bedside. No further needs.   Final next level of care: Home/Self Care Barriers to Discharge: No Barriers Identified   Patient Goals and CMS Choice      Discharge Placement                         Discharge Plan and Services Additional resources added to the After Visit Summary for                                       Social Determinants of Health (SDOH) Interventions SDOH Screenings   Food Insecurity: No Food Insecurity (08/08/2022)  Housing: [place]  (08/08/2022)  Transportation Needs: No Transportation Needs (08/08/2022)  Utilities: Not At Risk (08/08/2022)  Tobacco Use: Low Risk  (08/08/2022)     Readmission Risk Interventions     No data to display

## 2022-08-09 NOTE — Plan of Care (Signed)
  Problem: Education: Goal: Ability to describe self-care measures that may prevent or decrease complications (Diabetes Survival Skills Education) will improve Outcome: Not Progressing Goal: Individualized Educational Video(s) Outcome: Not Progressing   Problem: Coping: Goal: Ability to adjust to condition or change in health will improve Outcome: Not Progressing   Problem: Fluid Volume: Goal: Ability to maintain a balanced intake and output will improve Outcome: Not Progressing   Problem: Health Behavior/Discharge Planning: Goal: Ability to identify and utilize available resources and services will improve Outcome: Not Progressing Goal: Ability to manage health-related needs will improve Outcome: Not Progressing   Problem: Metabolic: Goal: Ability to maintain appropriate glucose levels will improve Outcome: Not Progressing   Problem: Nutritional: Goal: Maintenance of adequate nutrition will improve Outcome: Not Progressing Goal: Progress toward achieving an optimal weight will improve Outcome: Not Progressing   Problem: Skin Integrity: Goal: Risk for impaired skin integrity will decrease Outcome: Not Progressing   Problem: Tissue Perfusion: Goal: Adequacy of tissue perfusion will improve Outcome: Not Progressing   Problem: Education: Goal: Ability to describe self-care measures that may prevent or decrease complications (Diabetes Survival Skills Education) will improve Outcome: Not Progressing Goal: Individualized Educational Video(s) Outcome: Not Progressing   Problem: Cardiac: Goal: Ability to maintain an adequate cardiac output will improve Outcome: Not Progressing   Problem: Health Behavior/Discharge Planning: Goal: Ability to identify and utilize available resources and services will improve Outcome: Not Progressing Goal: Ability to manage health-related needs will improve Outcome: Not Progressing   Problem: Fluid Volume: Goal: Ability to achieve a  balanced intake and output will improve Outcome: Not Progressing   Problem: Metabolic: Goal: Ability to maintain appropriate glucose levels will improve Outcome: Not Progressing   Problem: Nutritional: Goal: Maintenance of adequate nutrition will improve Outcome: Not Progressing Goal: Maintenance of adequate weight for body size and type will improve Outcome: Not Progressing   Problem: Respiratory: Goal: Will regain and/or maintain adequate ventilation Outcome: Not Progressing   Problem: Urinary Elimination: Goal: Ability to achieve and maintain adequate renal perfusion and functioning will improve Outcome: Not Progressing   Problem: Education: Goal: Knowledge of General Education information will improve Description: Including pain rating scale, medication(s)/side effects and non-pharmacologic comfort measures Outcome: Not Progressing   Problem: Health Behavior/Discharge Planning: Goal: Ability to manage health-related needs will improve Outcome: Not Progressing   Problem: Clinical Measurements: Goal: Ability to maintain clinical measurements within normal limits will improve Outcome: Not Progressing Goal: Will remain free from infection Outcome: Not Progressing Goal: Diagnostic test results will improve Outcome: Not Progressing Goal: Respiratory complications will improve Outcome: Not Progressing Goal: Cardiovascular complication will be avoided Outcome: Not Progressing   Problem: Activity: Goal: Risk for activity intolerance will decrease Outcome: Not Progressing   Problem: Nutrition: Goal: Adequate nutrition will be maintained Outcome: Not Progressing   Problem: Coping: Goal: Level of anxiety will decrease Outcome: Not Progressing   Problem: Elimination: Goal: Will not experience complications related to bowel motility Outcome: Not Progressing Goal: Will not experience complications related to urinary retention Outcome: Not Progressing   Problem: Pain  Managment: Goal: General experience of comfort will improve Outcome: Not Progressing   Problem: Safety: Goal: Ability to remain free from injury will improve Outcome: Not Progressing   Problem: Skin Integrity: Goal: Risk for impaired skin integrity will decrease Outcome: Not Progressing

## 2022-08-09 NOTE — Discharge Instructions (Signed)
Patient advised [facility]. Check your sugars at least 2 to 3 times a day.

## 2022-08-09 NOTE — Progress Notes (Signed)
Patient educated on AVS including where and when to follow up.  When to take medications and how much.  Was delivered a meter and supplies at bedside including insulin for home.  PIVs were removed and pressure was held for hemostasis.  Patient removed from monitor and tele made aware of DC.  All questions answered and informed MD of patient wanting a note since she was supposed to be in court today and tomorrow.  Note provided per MD.  Patient dressed and awaiting on ride.

## 2022-08-09 NOTE — Progress Notes (Addendum)
Inpatient Diabetes Program Recommendations  AACE/ADA: New Consensus Statement on Inpatient Glycemic Control (2015)  Target Ranges:  Prepandial:   less than 140 mg/dL      Peak postprandial:   less than 180 mg/dL (1-2 hours)      Critically ill patients:  140 - 180 mg/dL   Lab Results  Component Value Date   GLUCAP 322 (H) 08/09/2022   HGBA1C 15.1 (H) 08/08/2022    Review of Glycemic Control  Latest Reference Range & Units 08/08/22 16:08 08/08/22 17:03 08/08/22 18:00 08/08/22 19:06 08/08/22 21:12 08/08/22 22:15 08/08/22 22:50 08/09/22 07:23 08/09/22 11:33  Glucose-Capillary 70 - 99 mg/dL 163 (H) 160 (H) 147 (H) 148 (H) 244 (H) 250 (H) 207 (H) 168 (H) 322 (H)   Diabetes history: DM 1 Outpatient Diabetes medications:  Lantus 15 units QHS Novolog 1 unit for every 15 grams og CHO's Novolog SSI Current orders for Inpatient glycemic control:  Levemir 18 units daily Novolog 0-15 units tid with meals and HS Novolog 4 units tid with meals   Inpatient Diabetes Program Recommendations:    Spoke with patient at bedside regarding DM management and A1C>15%.  She states that she ran out insulin and was unable to get refills due to not seeing MD.  Patient admits that when she monitors her blood sugars, they are usually in the 200-300 range.   Discussed with patient that in order to control blood sugars, she needs to take a shot with each meal and her basal insulin as well. Asked if she has ever taken 70/30 insulin which only requires 2 shots a day.  She states that she is agreeable to this.  I also explained that she can purchase 70/30 insulin at [facility] for 25$ a vial or 42$ for 5 pens.  Explained that she needs to take pre-mixed insulin with meals (with breakfast and evening meal).  We reviewed the complications of DM and the importance of glycemic control. A1C is so high that patient states that she has blurred vision.  She states that she feels low in the 100's.  Explained that normal blood sugars  are 80-130 mg/dL and should not be greater than 180 mg/dL after eating.  We reviewed hypoglycemia signs, symptoms and treatment.  Patient was able to teach back. Needs close follow-up with PCP.  Explained that weight loss (she states she's lost 10 pounds)- can also be due to not taking enough insulin.    Called medication management clinic and they have Humalog 75/25 pens that can be delivered to bedside.  Recommend 14 units of Humalog 75/25- 14 units bid.  (Note that patient can be transitioned to 70/30 if needed? Due to cost).  Patient is also requesting a meter/strips.   Thanks,  Adah Perl, RN, BC-ADM Inpatient Diabetes Coordinator Pager [phone]  (8a-5p)

## 2022-08-18 ENCOUNTER — Emergency Department
Admission: EM | Admit: 2022-08-18 | Discharge: 2022-08-18 | Disposition: A | Discharge status: Home / Self Care | Payer: Medicaid Other | Attending: Emergency Medicine | Admitting: Emergency Medicine

## 2022-08-18 ENCOUNTER — Other Ambulatory Visit: Payer: Self-pay

## 2022-08-18 DIAGNOSIS — L02411 Cutaneous abscess of right axilla: Secondary | ICD-10-CM | POA: Insufficient documentation

## 2022-08-18 DIAGNOSIS — Z711 Person with feared health complaint in whom no diagnosis is made: Secondary | ICD-10-CM

## 2022-08-18 MED ORDER — SULFAMETHOXAZOLE-TRIMETHOPRIM 800-160 MG PO TABS: 1.0000 | ORAL_TABLET | Freq: Two times a day (BID) | ORAL | 0 refills | Status: DC

## 2022-08-18 MED ORDER — LIDOCAINE HCL (PF) 1 % IJ SOLN
10.0000 mL | Freq: Once | INTRAMUSCULAR | Status: AC
  Administered 2022-08-18: 10 mL
  Filled 2022-08-18: qty 10

## 2022-08-18 MED ORDER — SULFAMETHOXAZOLE-TRIMETHOPRIM 800-160 MG PO TABS
1.0000 | ORAL_TABLET | Freq: Once | ORAL | Status: AC
  Administered 2022-08-18: 1 via ORAL
  Filled 2022-08-18: qty 1

## 2022-08-18 NOTE — ED Provider Notes (Addendum)
[facility] Provider Note  Patient Contact: 10:40 PM (approximate)   History   Abscess   HPI  Rebecca Callahan is a 27 y.o. female who presents the emergency department reportedly for an abscess according to triage.  I entered the room, patient was asleep.  Woke her up with a shake to the shoulder and she states that she is not the patient her daughter is. She states her daughter is being seen for a headache.  Patient arrives by herself and there is nobody else in the room.  I advised the patient there was no other patient and that her daughter was not currently in the room. Patient looks around and states "oh, ok"  I asked if the patient was here to be seen and she states "no."  I asked her if there is anything we can do for her and she verbalizes that there is nothing she needs.  Patient is not providing any further information at this time.     Physical Exam   Triage Vital Signs: ED Triage Vitals  Enc Vitals Group     BP 08/18/22 2100 123/78     Pulse Rate 08/18/22 2100 (!) 104     Resp 08/18/22 2100 18     Temp 08/18/22 2100 98.3 F (36.8 C)     Temp Source 08/18/22 2100 Oral     SpO2 08/18/22 2100 100 %     Weight --      Height --      Head Circumference --      Peak Flow --      Pain Score 08/18/22 2103 10     Pain Loc --      Pain Edu? --      Excl. in [place]? --     Most recent vital signs: Vitals:   08/18/22 2100  BP: 123/78  Pulse: (!) 104  Resp: 18  Temp: 98.3 F (36.8 C)  SpO2: 100%     General: Alert and in no acute distress.  Patient is very drowsy but was awoken with light shaking of the shoulder. Head: No acute traumatic findings   Cardiovascular:  Good peripheral perfusion Respiratory: Normal respiratory effort without tachypnea or retractions.   Musculoskeletal: Full range of motion to all extremities.  Neurologic:  No gross focal neurologic deficits are appreciated.  Skin:   No rash noted Other:   ED Results  / Procedures / Treatments   Labs (all labs ordered are listed, but only abnormal results are displayed) Labs Reviewed - No data to display   EKG     RADIOLOGY    No results found.  PROCEDURES:  Critical Care performed: No  ..Incision and Drainage  Date/Time: 08/18/2022 11:01 PM  Performed by: Darletta Moll, PA-C Authorized by: Darletta Moll, PA-C   Consent:    Consent obtained:  Verbal   Consent given by:  Patient   Risks discussed:  Bleeding, incomplete drainage and pain Universal protocol:    Procedure explained and questions answered to patient or proxy's satisfaction: yes     Immediately prior to procedure, a time out was called: yes     Patient identity confirmed:  Verbally with patient Location:    Type:  Abscess   Location:  Upper extremity   Upper extremity location: Axilla. Anesthesia:    Anesthesia method:  Local infiltration   Local anesthetic:  Lidocaine 1% w/o epi Procedure type:    Complexity:  Complex Procedure details:  Incision types:  Single straight   Incision depth:  Subcutaneous   Wound management:  Probed and deloculated and irrigated with saline   Drainage:  Purulent   Drainage amount:  Moderate   Wound treatment:  Wound left open   Packing materials:  None Post-procedure details:    Procedure completion:  Tolerated well, no immediate complications    MEDICATIONS ORDERED IN ED: Medications  lidocaine (PF) (XYLOCAINE) 1 % injection 10 mL (has no administration in time range)  sulfamethoxazole-trimethoprim (BACTRIM DS) 800-160 MG per tablet 1 tablet (has no administration in time range)     IMPRESSION / MDM / ASSESSMENT AND PLAN / ED COURSE  I reviewed the triage vital signs and the nursing notes.                                 Differential diagnosis includes, but is not limited to, abscess, rash, infection, diabetic problem   Patient's presentation is most consistent with acute presentation with  potential threat to life or bodily function.   Patient's diagnosis is consistent with free complaint without diagnosis.  Patient presents the emergency department reportedly for an abscess in her axilla.  However on my entering the room patient was asleep.  I woke her up and she states that she has not the patient, her daughter is the patient.  Advised her that she is the only person in the room.  Patient looks around, acknowledges this.  When I asked the patient with third she would like to be seen for anything has triage and said that she had an abscess.  Patient states no"". I noted that patient was a diabetic and offered to check sugar which she declines. At this time patient knows her name, location. While drowsy she appears capable of making her own decisions. I advised her at this time with no complaint that I would be discharging her.  Patient states "okay."  At this time patient will be discharged.  Patient is given ED precautions to return to the ED for any worsening or new symptoms.  Addendum: Nursing presented to the room to discharge the patient.  At this time patient states that she did in fact want to be seen.  She is complaining of an abscess in the right axilla.  No fevers or chills.  History of same.  Patient states that she has had multiple lanced and drained.  Procedure is documented above.  No other complaints other than abscess in the right axilla.  Patient will have incision and drainage performed with antibiotics prescribed for the patient.  I will place the patient on Bactrim.  Follow-up primary care as needed.   FINAL CLINICAL IMPRESSION(S) / ED DIAGNOSES   Final diagnoses:  Feared complaint without diagnosis  Abscess of axilla, right     Rx / DC Orders   ED Discharge Orders          Ordered    sulfamethoxazole-trimethoprim (BACTRIM DS) 800-160 MG tablet  2 times daily        08/18/22 2316             Note:  This document was prepared using Dragon voice  recognition software and may include unintentional dictation errors.   Darletta Moll, PA-C 08/18/22 2253    Darletta Moll, PA-C 08/18/22 2316    Rada Hay, MD 08/19/22 2329

## 2022-08-18 NOTE — ED Triage Notes (Signed)
Pt to ED via POV c/o abscess under right arm. Pt reports it began this week and has gotten more painful. Denies CP, SOB, dizziness

## 2023-03-20 ENCOUNTER — Other Ambulatory Visit: Payer: Self-pay

## 2023-03-20 ENCOUNTER — Emergency Department: Payer: Medicaid Other

## 2023-03-20 ENCOUNTER — Emergency Department
Admission: EM | Admit: 2023-03-20 | Discharge: 2023-03-20 | Disposition: A | Discharge status: Home / Self Care | Payer: Medicaid Other | Attending: Student in an Organized Health Care Education/Training Program | Admitting: Student in an Organized Health Care Education/Training Program

## 2023-03-20 DIAGNOSIS — R1031 Right lower quadrant pain: Secondary | ICD-10-CM

## 2023-03-20 DIAGNOSIS — R5383 Other fatigue: Secondary | ICD-10-CM | POA: Insufficient documentation

## 2023-03-20 DIAGNOSIS — A5901 Trichomonal vulvovaginitis: Secondary | ICD-10-CM | POA: Insufficient documentation

## 2023-03-20 DIAGNOSIS — E109 Type 1 diabetes mellitus without complications: Secondary | ICD-10-CM | POA: Insufficient documentation

## 2023-03-20 DIAGNOSIS — R5381 Other malaise: Secondary | ICD-10-CM | POA: Insufficient documentation

## 2023-03-20 LAB — POC URINE PREG, ED: Preg Test, Ur: NEGATIVE

## 2023-03-20 LAB — COMPREHENSIVE METABOLIC PANEL
ALT: 10 U/L (ref 0–44)
AST: 14 U/L — ABNORMAL LOW (ref 15–41)
Albumin: 3.8 g/dL (ref 3.5–5.0)
Alkaline Phosphatase: 77 U/L (ref 38–126)
Anion gap: 11 (ref 5–15)
BUN: 8 mg/dL (ref 6–20)
CO2: 24 mmol/L (ref 22–32)
Calcium: 8.7 mg/dL — ABNORMAL LOW (ref 8.9–10.3)
Chloride: 97 mmol/L — ABNORMAL LOW (ref 98–111)
Creatinine, Ser: 0.65 mg/dL (ref 0.44–1.00)
GFR, Estimated: >60 mL/min (ref >60)
Glucose, Bld: 391 mg/dL — ABNORMAL HIGH (ref 70–99)
Potassium: 3.9 mmol/L (ref 3.5–5.1)
Sodium: 132 mmol/L — ABNORMAL LOW (ref 135–145)
Total Bilirubin: 0.6 mg/dL (ref 0.3–1.2)
Total Protein: 7.6 g/dL (ref 6.5–8.1)

## 2023-03-20 LAB — URINALYSIS, ROUTINE W REFLEX MICROSCOPIC
Bacteria, UA: NONE SEEN
Bilirubin Urine: NEGATIVE
Glucose, UA: >=500 mg/dL — AB
Hgb urine dipstick: NEGATIVE
Ketones, ur: 20 mg/dL — AB
Nitrite: NEGATIVE
Protein, ur: NEGATIVE mg/dL
Specific Gravity, Urine: 1.03 (ref 1.005–1.030)
pH: 6 (ref 5.0–8.0)

## 2023-03-20 LAB — CBC
HCT: 32.8 % — ABNORMAL LOW (ref 36.0–46.0)
Hemoglobin: 9.4 g/dL — ABNORMAL LOW (ref 12.0–15.0)
MCH: 21.8 pg — ABNORMAL LOW (ref 26.0–34.0)
MCHC: 28.7 g/dL — ABNORMAL LOW (ref 30.0–36.0)
MCV: 75.9 fL — ABNORMAL LOW (ref 80.0–100.0)
Platelets: 324 10*3/uL (ref 150–400)
RBC: 4.32 MIL/uL (ref 3.87–5.11)
RDW: 16.3 % — ABNORMAL HIGH (ref 11.5–15.5)
WBC: 4.5 10*3/uL (ref 4.0–10.5)
nRBC: 0 % (ref 0.0–0.2)

## 2023-03-20 LAB — WET PREP, GENITAL
Clue Cells Wet Prep HPF POC: NONE SEEN
Sperm: NONE SEEN
WBC, Wet Prep HPF POC: <10 (ref <10)
Yeast Wet Prep HPF POC: NONE SEEN

## 2023-03-20 LAB — CHLAMYDIA/NGC RT PCR (ARMC ONLY)
Chlamydia Tr: NOT DETECTED
N gonorrhoeae: NOT DETECTED

## 2023-03-20 LAB — LIPASE, BLOOD: Lipase: 22 U/L (ref 11–51)

## 2023-03-20 MED ORDER — INSULIN ASPART 100 UNIT/ML IJ SOLN
8.0000 [IU] | Freq: Once | INTRAMUSCULAR | Status: AC
  Administered 2023-03-20: 8 [IU] via SUBCUTANEOUS
  Filled 2023-03-20: qty 1

## 2023-03-20 MED ORDER — MORPHINE SULFATE (PF) 4 MG/ML IV SOLN
4.0000 mg | INTRAVENOUS | Status: DC | PRN
  Administered 2023-03-20: 4 mg via INTRAVENOUS
  Filled 2023-03-20: qty 1

## 2023-03-20 MED ORDER — SODIUM CHLORIDE 0.9 % IV BOLUS
1000.0000 mL | Freq: Once | INTRAVENOUS | Status: AC
  Administered 2023-03-20: 1000 mL via INTRAVENOUS

## 2023-03-20 MED ORDER — ONDANSETRON HCL 4 MG/2ML IJ SOLN
4.0000 mg | Freq: Once | INTRAMUSCULAR | Status: AC
  Administered 2023-03-20: 4 mg via INTRAVENOUS
  Filled 2023-03-20: qty 2

## 2023-03-20 MED ORDER — METRONIDAZOLE 500 MG PO TABS: 500.0000 mg | ORAL_TABLET | Freq: Two times a day (BID) | ORAL | 0 refills | Status: AC

## 2023-03-20 MED ORDER — IOHEXOL 350 MG/ML SOLN
75.0000 mL | Freq: Once | INTRAVENOUS | Status: AC | PRN
  Administered 2023-03-20: 75 mL via INTRAVENOUS

## 2023-03-20 NOTE — ED Triage Notes (Signed)
Pt to Ed for lower abd pain for a few days and over all not feeling well. +nausea.

## 2023-03-20 NOTE — ED Notes (Signed)
See triage note  Presents with intermittent lower abd cramping    States this started couple of dys ago No fever or n/v  Does states that she was having some pain after voiding

## 2023-03-20 NOTE — ED Provider Notes (Signed)
[facility] Provider Note    Event Date/Time   First MD Initiated Contact with Patient 03/20/23 [phone]     (approximate)   History   Abdominal Pain   HPI  Rebecca Callahan is a 27 y.o. female with a history of insulin-dependent diabetes presents to the ER for evaluation of right lower quadrant pain generalized malaise and fatigue over the past few days.  Pain is progressively worsening.  Denies any dysuria.  Does have some discharge but not significantly out of the ordinary for her.  Denies chance being pregnant.     Physical Exam   Triage Vital Signs: ED Triage Vitals  Encounter Vitals Group     BP 03/20/23 0916 129/87     Systolic BP Percentile --      Diastolic BP Percentile --      Pulse Rate 03/20/23 0916 89     Resp 03/20/23 0916 18     Temp 03/20/23 0916 98.5 F (36.9 C)     Temp Source 03/20/23 0916 Oral     SpO2 03/20/23 0916 100 %     Weight 03/20/23 0906 150 lb (68 kg)     Height 03/20/23 0906 5\' 8"  (1.727 m)     Head Circumference --      Peak Flow --      Pain Score 03/20/23 0907 5     Pain Loc --      Pain Education --      Exclude from Growth Chart --     Most recent vital signs: Vitals:   03/20/23 0916  BP: 129/87  Pulse: 89  Resp: 18  Temp: 98.5 F (36.9 C)  SpO2: 100%     Constitutional: Alert  Eyes: Conjunctivae are normal.  Head: Atraumatic. Nose: No congestion/rhinnorhea. Mouth/Throat: Mucous membranes are moist.   Neck: Painless ROM.  Cardiovascular:   Good peripheral circulation. Respiratory: Normal respiratory effort.  No retractions.  Gastrointestinal: Soft with tenderness to palpation of the right lower quadrant. Musculoskeletal:  no deformity Neurologic:  MAE spontaneously. No gross focal neurologic deficits are appreciated.  Skin:  Skin is warm, dry and intact. No rash noted. Psychiatric: Mood and affect are normal. Speech and behavior are normal.    ED Results / Procedures / Treatments    Labs (all labs ordered are listed, but only abnormal results are displayed) Labs Reviewed  WET PREP, GENITAL - Abnormal; Notable for the following components:      Result Value   Trich, Wet Prep PRESENT (*)    All other components within normal limits  COMPREHENSIVE METABOLIC PANEL - Abnormal; Notable for the following components:   Sodium 132 (*)    Chloride 97 (*)    Glucose, Bld 391 (*)    Calcium 8.7 (*)    AST 14 (*)    All other components within normal limits  CBC - Abnormal; Notable for the following components:   Hemoglobin 9.4 (*)    HCT 32.8 (*)    MCV 75.9 (*)    MCH 21.8 (*)    MCHC 28.7 (*)    RDW 16.3 (*)    All other components within normal limits  URINALYSIS, ROUTINE W REFLEX MICROSCOPIC - Abnormal; Notable for the following components:   Color, Urine STRAW (*)    APPearance CLEAR (*)    Glucose, UA >=500 (*)    Ketones, ur 20 (*)    Leukocytes,Ua TRACE (*)    All other components within normal  limits  CHLAMYDIA/NGC RT PCR ([facility] ONLY)            LIPASE, BLOOD  POC URINE PREG, ED  CBG MONITORING, ED     EKG     RADIOLOGY Please see ED Course for my review and interpretation.  I personally reviewed all radiographic images ordered to evaluate for the above acute complaints and reviewed radiology reports and findings.  These findings were personally discussed with the patient.  Please see medical record for radiology report.    PROCEDURES:  Critical Care performed: No  Procedures   MEDICATIONS ORDERED IN ED: Medications  morphine (PF) 4 MG/ML injection 4 mg (4 mg Intravenous Given 03/20/23 1312)  sodium chloride 0.9 % bolus 1,000 mL (1,000 mLs Intravenous New Bag/Given 03/20/23 1127)  insulin aspart (novoLOG) injection 8 Units (8 Units Subcutaneous Given 03/20/23 1128)  iohexol (OMNIPAQUE) 350 MG/ML injection 75 mL (75 mLs Intravenous Contrast Given 03/20/23 1210)  ondansetron (ZOFRAN) injection 4 mg (4 mg Intravenous Given 03/20/23 1311)      IMPRESSION / MDM / ASSESSMENT AND PLAN / ED COURSE  I reviewed the triage vital signs and the nursing notes.                              Differential diagnosis includes, but is not limited to, appendicitis, colitis, TOA, torsion, cyst, BV, PID, pregnancy, constipation  Patient presenting to the ER for evaluation of symptoms as described above.  Based on symptoms, risk factors and considered above differential, this presenting complaint could reflect a potentially life-threatening illness therefore the patient will be placed on continuous pulse oximetry and telemetry for monitoring.  Laboratory evaluation will be sent to evaluate for the above complaints.     Clinical Course as of 03/20/23 1337  Tue Mar 20, 2023  1332 CT imaging is reassuring.  repeat abdominal exam soft and benign.  Discussed findings of trichomoniasis on her wet prep.  Does appear stable and appropriate for outpatient follow-up. [PR]    Clinical Course User Index [PR] Willy Eddy, MD     FINAL CLINICAL IMPRESSION(S) / ED DIAGNOSES   Final diagnoses:  Right lower quadrant abdominal pain  Trichomonal vaginitis     Rx / DC Orders   ED Discharge Orders          Ordered    metroNIDAZOLE (FLAGYL) 500 MG tablet  2 times daily        03/20/23 1335             Note:  This document was prepared using Dragon voice recognition software and may include unintentional dictation errors.    Willy Eddy, MD 03/20/23 [phone]

## 2023-03-25 ENCOUNTER — Other Ambulatory Visit: Payer: Self-pay

## 2023-03-25 ENCOUNTER — Emergency Department
Admission: EM | Admit: 2023-03-25 | Discharge: 2023-03-25 | Disposition: A | Discharge status: Home / Self Care | Payer: Medicaid Other | Attending: Emergency Medicine | Admitting: Emergency Medicine

## 2023-03-25 DIAGNOSIS — R42 Dizziness and giddiness: Secondary | ICD-10-CM | POA: Insufficient documentation

## 2023-03-25 DIAGNOSIS — Z1152 Encounter for screening for COVID-19: Secondary | ICD-10-CM | POA: Insufficient documentation

## 2023-03-25 DIAGNOSIS — E119 Type 2 diabetes mellitus without complications: Secondary | ICD-10-CM | POA: Insufficient documentation

## 2023-03-25 DIAGNOSIS — R55 Syncope and collapse: Secondary | ICD-10-CM | POA: Insufficient documentation

## 2023-03-25 DIAGNOSIS — R531 Weakness: Secondary | ICD-10-CM | POA: Insufficient documentation

## 2023-03-25 LAB — BASIC METABOLIC PANEL
Anion gap: 9 (ref 5–15)
BUN: 10 mg/dL (ref 6–20)
CO2: 22 mmol/L (ref 22–32)
Calcium: 8.7 mg/dL — ABNORMAL LOW (ref 8.9–10.3)
Chloride: 105 mmol/L (ref 98–111)
Creatinine, Ser: 0.57 mg/dL (ref 0.44–1.00)
GFR, Estimated: >60 mL/min (ref >60)
Glucose, Bld: 213 mg/dL — ABNORMAL HIGH (ref 70–99)
Potassium: 3.1 mmol/L — ABNORMAL LOW (ref 3.5–5.1)
Sodium: 136 mmol/L (ref 135–145)

## 2023-03-25 LAB — CBC
HCT: 30.8 % — ABNORMAL LOW (ref 36.0–46.0)
Hemoglobin: 8.9 g/dL — ABNORMAL LOW (ref 12.0–15.0)
MCH: 22.2 pg — ABNORMAL LOW (ref 26.0–34.0)
MCHC: 28.9 g/dL — ABNORMAL LOW (ref 30.0–36.0)
MCV: 76.8 fL — ABNORMAL LOW (ref 80.0–100.0)
Platelets: 317 10*3/uL (ref 150–400)
RBC: 4.01 MIL/uL (ref 3.87–5.11)
RDW: 15.9 % — ABNORMAL HIGH (ref 11.5–15.5)
WBC: 6.4 10*3/uL (ref 4.0–10.5)
nRBC: 0 % (ref 0.0–0.2)

## 2023-03-25 LAB — RESP PANEL BY RT-PCR (RSV, FLU A&B, COVID)  RVPGX2
Influenza A by PCR: NEGATIVE
Influenza B by PCR: NEGATIVE
Resp Syncytial Virus by PCR: NEGATIVE
SARS Coronavirus 2 by RT PCR: NEGATIVE

## 2023-03-25 LAB — CBG MONITORING, ED: Glucose-Capillary: 193 mg/dL — ABNORMAL HIGH (ref 70–99)

## 2023-03-25 MED ORDER — SODIUM CHLORIDE 0.9 % IV SOLN: Freq: Once | INTRAVENOUS | Status: AC

## 2023-03-25 MED ORDER — ONDANSETRON HCL 4 MG/2ML IJ SOLN
4.0000 mg | Freq: Once | INTRAMUSCULAR | Status: AC
  Administered 2023-03-25: 4 mg via INTRAVENOUS
  Filled 2023-03-25: qty 2

## 2023-03-25 MED ORDER — SODIUM CHLORIDE 0.9 % IV BOLUS
500.0000 mL | Freq: Once | INTRAVENOUS | Status: AC
  Administered 2023-03-25: 500 mL via INTRAVENOUS

## 2023-03-25 NOTE — ED Triage Notes (Signed)
Pt was having lunch today at [employer]- c/o nausea, feeling hot, and upset stomach. Pt is a type 1 diabetic. Pt AOX4, NAD noted.   90/55 BGL 286 12 units insulin  18 left AC- 300 ml's NS

## 2023-03-25 NOTE — ED Notes (Signed)
Pt states coming in with high blood sugar. Pt states she felt it and has generalized weakness. IVF infusing. Pt resting on bed, on phone with family at bedside. Pt denies pain.

## 2023-03-25 NOTE — ED Provider Notes (Signed)
[facility] Provider Note    Event Date/Time   First MD Initiated Contact with Patient 03/25/23 1454     (approximate)   History  Dizziness   HPI  Rebecca Callahan is a 27 y.o. female with a history of diabetes who presents with complaints of dizziness and weakness.  Patient reports she was eating lunch at [employer] and started to feel lightheaded.  She denies abdominal pain.  She is wondering if her sugar could be elevated.  Currently is feeling much better.  Denies chest pain or palpitations.  No fevers reported, no chills     Physical Exam   Triage Vital Signs: ED Triage Vitals  Encounter Vitals Group     BP 03/25/23 1448 (!) 93/58     Systolic BP Percentile --      Diastolic BP Percentile --      Pulse Rate 03/25/23 1448 82     Resp 03/25/23 1448 16     Temp 03/25/23 1448 98.1 F (36.7 C)     Temp Source 03/25/23 1448 Oral     SpO2 03/25/23 1448 100 %     Weight --      Height --      Head Circumference --      Peak Flow --      Pain Score 03/25/23 1451 0     Pain Loc --      Pain Education --      Exclude from Growth Chart --     Most recent vital signs: Vitals:   03/25/23 1448  BP: (!) 93/58  Pulse: 82  Resp: 16  Temp: 98.1 F (36.7 C)  SpO2: 100%     General: Awake, no distress.  CV:  Good peripheral perfusion.  Regular rate and rhythm Resp:  Normal effort.  Clear to auscultation bilaterally Abd:  No distention.  Soft, nontender Other:     ED Results / Procedures / Treatments   Labs (all labs ordered are listed, but only abnormal results are displayed) Labs Reviewed  BASIC METABOLIC PANEL - Abnormal; Notable for the following components:      Result Value   Potassium 3.1 (*)    Glucose, Bld 213 (*)    Calcium 8.7 (*)    All other components within normal limits  CBC - Abnormal; Notable for the following components:   Hemoglobin 8.9 (*)    HCT 30.8 (*)    MCV 76.8 (*)    MCH 22.2 (*)    MCHC 28.9 (*)     RDW 15.9 (*)    All other components within normal limits  CBG MONITORING, ED - Abnormal; Notable for the following components:   Glucose-Capillary 193 (*)    All other components within normal limits  RESP PANEL BY RT-PCR (RSV, FLU A&B, COVID)  [id number]  URINALYSIS, ROUTINE W REFLEX MICROSCOPIC  CBG MONITORING, ED  POC URINE PREG, ED     EKG     RADIOLOGY     PROCEDURES:  Critical Care performed:   Procedures   MEDICATIONS ORDERED IN ED: Medications  sodium chloride 0.9 % bolus 500 mL (0 mLs Intravenous Stopped 03/25/23 1549)  0.9 %  sodium chloride infusion ( Intravenous New Bag/Given 03/25/23 1549)  ondansetron (ZOFRAN) injection 4 mg (4 mg Intravenous Given 03/25/23 1550)     IMPRESSION / MDM / ASSESSMENT AND PLAN / ED COURSE  I reviewed the triage vital signs and the nursing notes. Patient's presentation is most  consistent with acute presentation with potential threat to life or bodily function.  Patient with a history of diabetes presents with weakness, dizziness as above.  Feeling better at this time.  Differential includes hyperglycemia, diabetic ketoacidosis, viral illness  Will treat with IV fluids, obtain labs and reevaluate, IV Zofran ordered  Lab work demonstrates anemia which is not significantly changed from prior, additionally patient's BMP is reassuring, anion gap is normal, glucose unremarkable.  ----------------------------------------- 4:11 PM on 03/25/2023 ----------------------------------------- On reevaluation patient feeling improved, no further dizziness, lab work is overall unremarkable, appropriate for discharge at this time with outpatient follow-up, return precautions discussed, she agrees with this plan.  Blood pressure has improved to 115/80      FINAL CLINICAL IMPRESSION(S) / ED DIAGNOSES   Final diagnoses:  Near syncope     Rx / DC Orders   ED Discharge Orders     None        Note:  This document was prepared using  Dragon voice recognition software and may include unintentional dictation errors.   Jene Every, MD 03/25/23 [phone]

## 2023-04-01 ENCOUNTER — Other Ambulatory Visit: Payer: Self-pay

## 2023-04-01 ENCOUNTER — Emergency Department
Admission: EM | Admit: 2023-04-01 | Discharge: 2023-04-01 | Disposition: A | Discharge status: Home / Self Care | Payer: Medicaid Other | Attending: Emergency Medicine | Admitting: Emergency Medicine

## 2023-04-01 DIAGNOSIS — Z8619 Personal history of other infectious and parasitic diseases: Secondary | ICD-10-CM

## 2023-04-01 DIAGNOSIS — R799 Abnormal finding of blood chemistry, unspecified: Secondary | ICD-10-CM | POA: Diagnosis not present

## 2023-04-01 DIAGNOSIS — R1031 Right lower quadrant pain: Secondary | ICD-10-CM

## 2023-04-01 LAB — CBC
HCT: 30.3 % — ABNORMAL LOW (ref 36.0–46.0)
Hemoglobin: 8.8 g/dL — ABNORMAL LOW (ref 12.0–15.0)
MCH: 22.2 pg — ABNORMAL LOW (ref 26.0–34.0)
MCHC: 29 g/dL — ABNORMAL LOW (ref 30.0–36.0)
MCV: 76.3 fL — ABNORMAL LOW (ref 80.0–100.0)
Platelets: 272 10*3/uL (ref 150–400)
RBC: 3.97 MIL/uL (ref 3.87–5.11)
RDW: 15.9 % — ABNORMAL HIGH (ref 11.5–15.5)
WBC: 5.1 10*3/uL (ref 4.0–10.5)
nRBC: 0 % (ref 0.0–0.2)

## 2023-04-01 LAB — POC URINE PREG, ED: Preg Test, Ur: NEGATIVE

## 2023-04-01 LAB — COMPREHENSIVE METABOLIC PANEL
ALT: 8 U/L (ref 0–44)
AST: 17 U/L (ref 15–41)
Albumin: 3.8 g/dL (ref 3.5–5.0)
Alkaline Phosphatase: 83 U/L (ref 38–126)
Anion gap: 8 (ref 5–15)
BUN: 12 mg/dL (ref 6–20)
CO2: 25 mmol/L (ref 22–32)
Calcium: 8.7 mg/dL — ABNORMAL LOW (ref 8.9–10.3)
Chloride: 102 mmol/L (ref 98–111)
Creatinine, Ser: 0.61 mg/dL (ref 0.44–1.00)
GFR, Estimated: >60 mL/min (ref >60)
Glucose, Bld: 131 mg/dL — ABNORMAL HIGH (ref 70–99)
Potassium: 2.8 mmol/L — ABNORMAL LOW (ref 3.5–5.1)
Sodium: 135 mmol/L (ref 135–145)
Total Bilirubin: 0.4 mg/dL (ref <1.2)
Total Protein: 7.8 g/dL (ref 6.5–8.1)

## 2023-04-01 LAB — URINALYSIS, ROUTINE W REFLEX MICROSCOPIC
Bilirubin Urine: NEGATIVE
Glucose, UA: NEGATIVE mg/dL
Hgb urine dipstick: NEGATIVE
Ketones, ur: NEGATIVE mg/dL
Nitrite: NEGATIVE
Protein, ur: NEGATIVE mg/dL
Specific Gravity, Urine: 1.015 (ref 1.005–1.030)
pH: 9 — ABNORMAL HIGH (ref 5.0–8.0)

## 2023-04-01 LAB — CBG MONITORING, ED: Glucose-Capillary: 137 mg/dL — ABNORMAL HIGH (ref 70–99)

## 2023-04-01 LAB — LIPASE, BLOOD: Lipase: 25 U/L (ref 11–51)

## 2023-04-01 MED ORDER — CEFDINIR 300 MG PO CAPS
300.0000 mg | ORAL_CAPSULE | Freq: Once | ORAL | Status: AC
  Administered 2023-04-01: 300 mg via ORAL
  Filled 2023-04-01: qty 1

## 2023-04-01 MED ORDER — MELOXICAM 15 MG PO TABS
15.0000 mg | ORAL_TABLET | Freq: Every day | ORAL | 0 refills | Status: AC
Start: 2023-04-01 — End: 2023-04-15

## 2023-04-01 NOTE — ED Provider Notes (Signed)
[facility] Provider Note   Event Date/Time   First MD Initiated Contact with Patient 04/01/23 2224     (approximate) History  Abdominal Pain  HPI Rebecca Callahan is a 27 y.o. female with a recent diagnosis of trichomoniasis who presents complaining of persistent right lower quadrant abdominal pain which was similar to her previous trichomoniasis pain.  Patient states that patient's symptoms improved initially with antibiotics however when they stopped her pain got worse again. ROS: Patient currently denies any vision changes, tinnitus, difficulty speaking, facial droop, sore throat, chest pain, shortness of breath, abnormal vaginal discharge, vaginal bleeding, nausea/vomiting/diarrhea, dysuria, or weakness/numbness/paresthesias in any extremity   Physical Exam  Triage Vital Signs: ED Triage Vitals [04/01/23 1944]  Encounter Vitals Group     BP (!) 129/93     Systolic BP Percentile      Diastolic BP Percentile      Pulse Rate (!) 105     Resp 16     Temp 98.3 F (36.8 C)     Temp Source Oral     SpO2 100 %     Weight 149 lb 14.6 oz (68 kg)     Height 5\' 8"  (1.727 m)     Head Circumference      Peak Flow      Pain Score 8     Pain Loc      Pain Education      Exclude from Growth Chart    Most recent vital signs: Vitals:   04/01/23 1944  BP: (!) 129/93  Pulse: (!) 105  Resp: 16  Temp: 98.3 F (36.8 C)  SpO2: 100%   General: Awake, oriented x4. CV:  Good peripheral perfusion.  Resp:  Normal effort.  Abd:  No distention.  Mild generalized lower quadrant abdominal tenderness to palpation Other:  Young adult well-developed, well-nourished African-American female resting comfortably in no acute distress ED Results / Procedures / Treatments  Labs (all labs ordered are listed, but only abnormal results are displayed) Labs Reviewed  COMPREHENSIVE METABOLIC PANEL - Abnormal; Notable for the following components:      Result Value    Potassium 2.8 (*)    Glucose, Bld 131 (*)    Calcium 8.7 (*)    All other components within normal limits  CBC - Abnormal; Notable for the following components:   Hemoglobin 8.8 (*)    HCT 30.3 (*)    MCV 76.3 (*)    MCH 22.2 (*)    MCHC 29.0 (*)    RDW 15.9 (*)    All other components within normal limits  URINALYSIS, ROUTINE W REFLEX MICROSCOPIC - Abnormal; Notable for the following components:   Color, Urine YELLOW (*)    APPearance HAZY (*)    pH 9.0 (*)    Leukocytes,Ua TRACE (*)    Bacteria, UA RARE (*)    All other components within normal limits  CBG MONITORING, ED - Abnormal; Notable for the following components:   Glucose-Capillary 137 (*)    All other components within normal limits  LIPASE, BLOOD  POC URINE PREG, ED   PROCEDURES: Critical Care performed: No .1-3 Lead EKG Interpretation  Performed by: Merwyn Katos, MD Authorized by: Merwyn Katos, MD     Interpretation: normal     ECG rate:  91   ECG rate assessment: normal     Rhythm: sinus rhythm     Ectopy: none     Conduction: normal  MEDICATIONS ORDERED IN ED: Medications  cefdinir (OMNICEF) capsule 300 mg (300 mg Oral Given 04/01/23 2312)   IMPRESSION / MDM / ASSESSMENT AND PLAN / ED COURSE  I reviewed the triage vital signs and the nursing notes.                             The patient is on the cardiac monitor to evaluate for evidence of arrhythmia and/or significant heart rate changes. Patient's presentation is most consistent with acute presentation with potential threat to life or bodily function. Patient's symptoms not typical for emergent causes of abdominal pain such as, but not limited to, appendicitis, abdominal aortic aneurysm, surgical biliary disease, pancreatitis, SBO, mesenteric ischemia, serious intra-abdominal bacterial illness. Presentation also not typical of gynecologic emergencies such as TOA, Ovarian Torsion, PID. Not Ectopic. Doubt atypical ACS.  Pt tolerating  PO. Disposition: Patient will be discharged with strict return precautions and follow up with primary MD within 12-24 hours for further evaluation. Patient understands that this still may have an early presentation of an emergent medical condition such as appendicitis that will require a recheck.   FINAL CLINICAL IMPRESSION(S) / ED DIAGNOSES   Final diagnoses:  Right lower quadrant abdominal pain  History of trichomoniasis   Rx / DC Orders   ED Discharge Orders          Ordered    meloxicam (MOBIC) 15 MG tablet  Daily        04/01/23 2325           Note:  This document was prepared using Dragon voice recognition software and may include unintentional dictation errors.   Merwyn Katos, MD 04/01/23 [phone]

## 2023-04-01 NOTE — ED Triage Notes (Signed)
Pt to ed from home via POV for abdominal pain x 1 week. Pt was seen here on 11/3 for same and doesn't feel any better. Pt is caox4, in no acute distress and ambulatory in triage.

## 2023-12-09 ENCOUNTER — Other Ambulatory Visit: Payer: Self-pay

## 2023-12-09 ENCOUNTER — Emergency Department

## 2023-12-09 ENCOUNTER — Encounter: Payer: Self-pay | Admitting: Emergency Medicine

## 2023-12-09 ENCOUNTER — Emergency Department
Admission: EM | Admit: 2023-12-09 | Discharge: 2023-12-09 | Disposition: A | Discharge status: Home / Self Care | Attending: Emergency Medicine | Admitting: Emergency Medicine

## 2023-12-09 DIAGNOSIS — N390 Urinary tract infection, site not specified: Secondary | ICD-10-CM | POA: Diagnosis not present

## 2023-12-09 DIAGNOSIS — R109 Unspecified abdominal pain: Secondary | ICD-10-CM

## 2023-12-09 DIAGNOSIS — E119 Type 2 diabetes mellitus without complications: Secondary | ICD-10-CM | POA: Diagnosis not present

## 2023-12-09 DIAGNOSIS — R1030 Lower abdominal pain, unspecified: Secondary | ICD-10-CM | POA: Diagnosis present

## 2023-12-09 LAB — URINALYSIS, ROUTINE W REFLEX MICROSCOPIC
Bilirubin Urine: NEGATIVE
Glucose, UA: >=500 mg/dL — AB
Ketones, ur: NEGATIVE mg/dL
Nitrite: NEGATIVE
Protein, ur: 100 mg/dL — AB
RBC / HPF: >50 RBC/hpf (ref 0–5)
Specific Gravity, Urine: 1.029 (ref 1.005–1.030)
WBC, UA: >50 WBC/hpf (ref 0–5)
pH: 6 (ref 5.0–8.0)

## 2023-12-09 LAB — COMPREHENSIVE METABOLIC PANEL WITH GFR
ALT: 7 U/L (ref 0–44)
AST: 14 U/L — ABNORMAL LOW (ref 15–41)
Albumin: 3.5 g/dL (ref 3.5–5.0)
Alkaline Phosphatase: 75 U/L (ref 38–126)
Anion gap: 9 (ref 5–15)
BUN: 13 mg/dL (ref 6–20)
CO2: 22 mmol/L (ref 22–32)
Calcium: 8.9 mg/dL (ref 8.9–10.3)
Chloride: 105 mmol/L (ref 98–111)
Creatinine, Ser: 0.63 mg/dL (ref 0.44–1.00)
GFR, Estimated: >60 mL/min (ref >60)
Glucose, Bld: 337 mg/dL — ABNORMAL HIGH (ref 70–99)
Potassium: 4.2 mmol/L (ref 3.5–5.1)
Sodium: 136 mmol/L (ref 135–145)
Total Bilirubin: 0.4 mg/dL (ref 0.0–1.2)
Total Protein: 6.9 g/dL (ref 6.5–8.1)

## 2023-12-09 LAB — CBC
HCT: 27.6 % — ABNORMAL LOW (ref 36.0–46.0)
Hemoglobin: 7.9 g/dL — ABNORMAL LOW (ref 12.0–15.0)
MCH: 21.3 pg — ABNORMAL LOW (ref 26.0–34.0)
MCHC: 28.6 g/dL — ABNORMAL LOW (ref 30.0–36.0)
MCV: 74.4 fL — ABNORMAL LOW (ref 80.0–100.0)
Platelets: 182 K/uL (ref 150–400)
RBC: 3.71 MIL/uL — ABNORMAL LOW (ref 3.87–5.11)
RDW: 17 % — ABNORMAL HIGH (ref 11.5–15.5)
WBC: 4.7 K/uL (ref 4.0–10.5)
nRBC: 0 % (ref 0.0–0.2)

## 2023-12-09 LAB — LIPASE, BLOOD: Lipase: 27 U/L (ref 11–51)

## 2023-12-09 LAB — POC URINE PREG, ED: Preg Test, Ur: NEGATIVE

## 2023-12-09 MED ORDER — PHENAZOPYRIDINE HCL 97.2 MG PO TABS: 97.0000 mg | ORAL_TABLET | Freq: Three times a day (TID) | ORAL | 0 refills | Status: DC | PRN

## 2023-12-09 MED ORDER — SODIUM CHLORIDE 0.9 % IV SOLN
1.0000 g | Freq: Once | INTRAVENOUS | Status: AC
Start: 2023-12-09 — End: 2023-12-09
  Administered 2023-12-09: 1 g via INTRAVENOUS
  Filled 2023-12-09: qty 10

## 2023-12-09 MED ORDER — LACTATED RINGERS IV BOLUS
1000.0000 mL | Freq: Once | INTRAVENOUS | Status: AC
  Administered 2023-12-09: 1000 mL via INTRAVENOUS

## 2023-12-09 MED ORDER — KETOROLAC TROMETHAMINE 30 MG/ML IJ SOLN
30.0000 mg | Freq: Once | INTRAMUSCULAR | Status: AC
  Administered 2023-12-09: 30 mg via INTRAVENOUS
  Filled 2023-12-09: qty 1

## 2023-12-09 MED ORDER — CEFUROXIME AXETIL 250 MG PO TABS: 250.0000 mg | ORAL_TABLET | Freq: Two times a day (BID) | ORAL | 0 refills | Status: DC

## 2023-12-09 NOTE — ED Triage Notes (Signed)
 Pt to ED from home c/o lower abd cramping and pain radiating around to back, worse with urination.  States dx with BV last week and took last dose ABX this morning.

## 2023-12-09 NOTE — ED Provider Notes (Signed)
 [facility] Provider Note   Event Date/Time   First MD Initiated Contact with Patient 12/09/23 0700     (approximate) History  Abdominal Pain  HPI Rebecca Callahan is a 28 y.o. female with a past medical history of type 2 diabetes who presents complaining of lower abdominal cramping that radiates around to right side back and is worse with urination.  Patient states she was recently treated with metronidazole  for a presumed BV infection last week however she states she took her last dose of antibiotics today without any improvement in her dysuria symptoms. ROS: Patient currently denies any vision changes, tinnitus, difficulty speaking, facial droop, sore throat, chest pain, shortness of breath, nausea/vomiting/diarrhea, or weakness/numbness/paresthesias in any extremity   Physical Exam  Triage Vital Signs: ED Triage Vitals [12/09/23 0610]  Encounter Vitals Group     BP (!) 132/93     Girls Systolic BP Percentile      Girls Diastolic BP Percentile      Boys Systolic BP Percentile      Boys Diastolic BP Percentile      Pulse Rate 82     Resp 16     Temp 98.4 F (36.9 C)     Temp Source Oral     SpO2 100 %     Weight 155 lb (70.3 kg)     Height 5' 8 (1.727 m)     Head Circumference      Peak Flow      Pain Score 9     Pain Loc      Pain Education      Exclude from Growth Chart    Most recent vital signs: Vitals:   12/09/23 0610  BP: (!) 132/93  Pulse: 82  Resp: 16  Temp: 98.4 F (36.9 C)  SpO2: 100%   General: Awake, oriented x4. CV:  Good peripheral perfusion. Resp:  Normal effort. Abd:  No distention. Other:  Middle-aged well-developed, nourished African-American female resting comfortably in no acute distress ED Results / Procedures / Treatments  Labs (all labs ordered are listed, but only abnormal results are displayed) Labs Reviewed  COMPREHENSIVE METABOLIC PANEL WITH GFR - Abnormal; Notable for the following components:       Result Value   Glucose, Bld 337 (*)    AST 14 (*)    All other components within normal limits  CBC - Abnormal; Notable for the following components:   RBC 3.71 (*)    Hemoglobin 7.9 (*)    HCT 27.6 (*)    MCV 74.4 (*)    MCH 21.3 (*)    MCHC 28.6 (*)    RDW 17.0 (*)    All other components within normal limits  URINALYSIS, ROUTINE W REFLEX MICROSCOPIC - Abnormal; Notable for the following components:   Color, Urine YELLOW (*)    APPearance CLOUDY (*)    Glucose, UA >=500 (*)    Hgb urine dipstick MODERATE (*)    Protein, ur 100 (*)    Leukocytes,Ua LARGE (*)    Bacteria, UA RARE (*)    All other components within normal limits  LIPASE, BLOOD  POC URINE PREG, ED   RADIOLOGY ED MD interpretation: CT noncontrast of the abdomen pelvis showed no evidence of acute abnormalities - All radiology independently interpreted and agree with radiology assessment Official radiology report(s): CT Renal Stone Study Result Date: 12/09/2023 CLINICAL DATA:  28 year old female with lower abdominal pain and cramping, radiating to the back. Symptoms  exacerbated by urination. EXAM: CT ABDOMEN AND PELVIS WITHOUT CONTRAST TECHNIQUE: Multidetector CT imaging of the abdomen and pelvis was performed following the standard protocol without IV contrast. RADIATION DOSE REDUCTION: This exam was performed according to the departmental dose-optimization program which includes automated exposure control, adjustment of the mA and/or kV according to patient size and/or use of iterative reconstruction technique. COMPARISON:  CT Abdomen and Pelvis 03/20/2023. FINDINGS: Lower chest: Heart size is normal. No pericardial or pleural effusion. Partially visible mild peribronchial patchy opacity in the medial basal segment of the right lower lobe, azygoesophageal recess is non dependent and could be infectious or inflammatory (series 4, images 1 and 4. No similar lung base opacity elsewhere. Hepatobiliary: Negative noncontrast  liver and gallbladder. Pancreas: Negative. Spleen: Negative. Adrenals/Urinary Tract: Normal adrenal glands. Noncontrast kidneys appear symmetric and within normal limits. No nephrolithiasis or renal inflammation identified. No evidence of hydronephrosis or hydroureter. Urinary bladder appears indistinct on series 2, image 73. Punctate pelvic phleboliths on the left are stable. Stomach/Bowel: Redundant large bowel with mild to moderate retained stool throughout. Normal gas containing appendix on series 2, image 44. No large bowel inflammation identified. Nondilated small bowel. Stomach and duodenum appear negative. No pneumoperitoneum. No free fluid or mesenteric inflammation identified. Vascular/Lymphatic: Noncontrast appearance within normal limits. Reproductive: Noncontrast appearance within normal limits. Other: No pelvis free fluid. Musculoskeletal: Negative. IMPRESSION: 1. Indistinct urinary bladder suspicious for Acute UTI or Cystitis. 2. Partially visible patchy peribronchial opacity in the right lower lobe suspicious for mild or developing Bronchopneumonia. No pleural effusion. 3. No other acute or inflammatory process identified in the noncontrast abdomen or pelvis. Redundant large bowel with retained stool. Normal appendix. Electronically Signed   By: VEAR Hurst M.D.   On: 12/09/2023 07:50   PROCEDURES: Critical Care performed: No Procedures MEDICATIONS ORDERED IN ED: Medications  lactated ringers  bolus 1,000 mL (1,000 mLs Intravenous New Bag/Given 12/09/23 0738)  ketorolac  (TORADOL ) 30 MG/ML injection 30 mg (30 mg Intravenous Given 12/09/23 0738)  cefTRIAXone  (ROCEPHIN ) 1 g in sodium chloride  0.9 % 100 mL IVPB (0 g Intravenous Stopped 12/09/23 0757)   IMPRESSION / MDM / ASSESSMENT AND PLAN / ED COURSE  I reviewed the triage vital signs and the nursing notes.                             The patient is on the cardiac monitor to evaluate for evidence of arrhythmia and/or significant heart rate  changes. Patient's presentation is most consistent with acute presentation with potential threat to life or bodily function. Not Pregnant. Unlikely TOA, Ovarian Torsion, PID, gonorrhea/chlamydia. Low suspicion for Infected Urolithiasis, AAA, Cholecystitis, Pancreatitis, SBO, Appendicitis, or other acute abdomen.  Rx: Ceftin  250 mg BID for 7 days Disposition: Discharge home. SRP discussed. Advise follow up with primary care provider within 24-72 hours.   FINAL CLINICAL IMPRESSION(S) / ED DIAGNOSES   Final diagnoses:  Acute right flank pain  Urinary tract infection with hematuria, site unspecified   Rx / DC Orders   ED Discharge Orders          Ordered    cefUROXime  (CEFTIN ) 250 MG tablet  2 times daily with meals        12/09/23 0815    phenazopyridine  (PYRIDIUM ) 97 MG tablet  3 times daily PRN        12/09/23 0815           Note:  This document was prepared using  [employer] and may include unintentional dictation errors.   Jossie Artist POUR, MD 12/09/23 [phone]

## 2024-03-01 ENCOUNTER — Ambulatory Visit
Admission: EM | Admit: 2024-03-01 | Discharge: 2024-03-01 | Disposition: A | Discharge status: Home / Self Care | Attending: Emergency Medicine | Admitting: Emergency Medicine

## 2024-03-01 ENCOUNTER — Encounter: Payer: Self-pay | Admitting: Emergency Medicine

## 2024-03-01 DIAGNOSIS — R350 Frequency of micturition: Secondary | ICD-10-CM | POA: Diagnosis not present

## 2024-03-01 DIAGNOSIS — R1024 Suprapubic pain: Secondary | ICD-10-CM

## 2024-03-01 LAB — POCT URINE DIPSTICK
Glucose, UA: 500 mg/dL — AB
Leukocytes, UA: NEGATIVE
Nitrite, UA: NEGATIVE
POC PROTEIN,UA: 100 — AB
Spec Grav, UA: >=1.03 — AB (ref 1.010–1.025)
Urobilinogen, UA: 0.2 U/dL
pH, UA: 6 (ref 5.0–8.0)

## 2024-03-01 LAB — POCT URINE PREGNANCY: Preg Test, Ur: NEGATIVE

## 2024-03-01 LAB — GLUCOSE, POCT (MANUAL RESULT ENTRY): POC Glucose: 304 mg/dL — AB (ref 70–99)

## 2024-03-01 MED ORDER — SULFAMETHOXAZOLE-TRIMETHOPRIM 800-160 MG PO TABS: 1.0000 | ORAL_TABLET | Freq: Two times a day (BID) | ORAL | 0 refills | Status: DC

## 2024-03-01 NOTE — ED Provider Notes (Signed)
 Rebecca Callahan    CSN: [id number] Arrival date & time: 03/01/24  9062      History   Chief Complaint Chief Complaint  Patient presents with   Abdominal Pain   Urinary Frequency   Dysuria    HPI Rebecca Callahan is a 28 y.o. female.   Patient presents for evaluation of urinary frequency, dysuria, lower abdominal pressure and itching after urination beginning 3 days ago.  Has attempted use of ibuprofen.  Denies vaginal discharge, vaginal odor, flank pain or fever.  No known exposure.  Last menstrual period 01/22/2024, currently 9 days late.  History of diabetes.  Past Medical History:  Diagnosis Date   Diabetes mellitus without complication [facility])     Patient Active Problem List   Diagnosis Date Noted   Malnutrition of moderate degree 08/09/2022   DKA (diabetic ketoacidosis) (HCC) 08/08/2022   Metabolic acidosis 08/08/2022   Pseudohyponatremia 08/08/2022    History reviewed. No pertinent surgical history.  OB History     Gravida  1   Para      Term      Preterm      AB      Living         SAB      IAB      Ectopic      Multiple      Live Births               Home Medications    Prior to Admission medications   Medication Sig Start Date End Date Taking? Authorizing Provider  sulfamethoxazole -trimethoprim  (BACTRIM  DS) 800-160 MG tablet Take 1 tablet by mouth 2 (two) times daily for 7 days. 03/01/24 03/08/24 Yes Fabiola Mudgett, Shelba SAUNDERS, NP  Blood Glucose Monitoring Suppl DEVI 1 each by Does not apply route in the morning, at noon, and at bedtime. May substitute to any manufacturer covered by patient's insurance. 08/09/22   Patel, Sona, MD  cefUROXime  (CEFTIN ) 250 MG tablet Take 1 tablet (250 mg total) by mouth 2 (two) times daily with a meal. 12/09/23   Bradler, Evan K, MD  Glucose Blood (BLOOD GLUCOSE TEST STRIPS) STRP 1 each by In Vitro route in the morning, at noon, and at bedtime. May substitute to any manufacturer covered by patient's  insurance. 08/09/22   Patel, Sona, MD  Insulin  Lispro Prot & Lispro (HUMALOG  75/25 MIX) (75-25) 100 UNIT/ML Kwikpen Inject 14 Units into the skin 2 (two) times daily with a meal. 08/09/22   Tobie Calix, MD  Insulin  Pen Needle 32G X 4 MM MISC use with humalog  kwikpen mix 75/25 twice daily 08/09/22   Patel, Sona, MD  Insulin  Pen Needle 32G X 4 MM MISC use with humalog  kwikpen mix 75/25 twice daily 08/09/22   Patel, Sona, MD  phenazopyridine  (PYRIDIUM ) 97 MG tablet Take 1 tablet (97 mg total) by mouth 3 (three) times daily as needed for pain. 12/09/23   Bradler, Evan K, MD    Family History History reviewed. No pertinent family history.  Social History Social History   Tobacco Use   Smoking status: Never   Smokeless tobacco: Never  Substance Use Topics   Alcohol use: Yes   Drug use: Yes    Types: Marijuana     Allergies   Amoxicillin   Review of Systems Review of Systems   Physical Exam Triage Vital Signs ED Triage Vitals  Encounter Vitals Group     BP 03/01/24 1037 104/72     Girls Systolic  BP Percentile --      Girls Diastolic BP Percentile --      Boys Systolic BP Percentile --      Boys Diastolic BP Percentile --      Pulse Rate 03/01/24 1037 88     Resp 03/01/24 1037 18     Temp 03/01/24 1037 98.4 F (36.9 C)     Temp Source 03/01/24 1037 Oral     SpO2 03/01/24 1037 98 %     Weight --      Height --      Head Circumference --      Peak Flow --      Pain Score 03/01/24 1031 8     Pain Loc --      Pain Education --      Exclude from Growth Chart --    No data found.  Updated Vital Signs BP 104/72 (BP Location: Left Arm)   Pulse 88   Temp 98.4 F (36.9 C) (Oral)   Resp 18   LMP 01/22/2024 (Approximate)   SpO2 98%   Breastfeeding No   Visual Acuity Right Eye Distance:   Left Eye Distance:   Bilateral Distance:    Right Eye Near:   Left Eye Near:    Bilateral Near:     Physical Exam Constitutional:      Appearance: Normal appearance.  Eyes:      Extraocular Movements: Extraocular movements intact.  Pulmonary:     Effort: Pulmonary effort is normal.  Abdominal:     General: Abdomen is flat. Bowel sounds are normal.     Palpations: Abdomen is soft.     Tenderness: There is abdominal tenderness in the suprapubic area.  Neurological:     Mental Status: She is alert and oriented to person, place, and time.      UC Treatments / Results  Labs (all labs ordered are listed, but only abnormal results are displayed) Labs Reviewed  POCT URINE DIPSTICK - Abnormal; Notable for the following components:      Result Value   Clarity, UA cloudy (*)    Glucose, UA =500 (*)    Bilirubin, UA small (*)    Ketones, POC UA small (15) (*)    Spec Grav, UA >=1.030 (*)    Blood, UA small (*)    POC PROTEIN,UA =100 (*)    All other components within normal limits  GLUCOSE, POCT (MANUAL RESULT ENTRY) - Abnormal; Notable for the following components:   POC Glucose 304 (*)    All other components within normal limits  POCT URINE PREGNANCY - Normal  URINE CULTURE    EKG   Radiology No results found.  Procedures Procedures (including critical care time)  Medications Ordered in UC Medications - No data to display  Initial Impression / Assessment and Plan / UC Course  I have reviewed the triage vital signs and the nursing notes.  Pertinent labs & imaging results that were available during my care of the patient were reviewed by me and considered in my medical decision making (see chart for details).  Acute suprapubic pain, urinary frequency  Urinalysis negative for leukocytes and nitrates, glucose greater than 500, sent for culture, discussed findings, empirically placed on Bactrim  however discussed that symptoms could be related to elevated glucose, recommended PCP follow-up, asymptomatic at this time for DKA, urine pregnancy test is negative, advise recheck in 1 month during expected timeframe, recommended nonpharmacological supportive  care with follow-up as needed Final Clinical  Impressions(s) / UC Diagnoses   Final diagnoses:  Suprapubic pain, acute  Urinary frequency     Discharge Instructions      Today you are evaluated for your urinary and abdominal symptoms  Urinalysis at this time does not show bacteria or infection, sample has been sent to the lab for the next 3 days to determine if bacteria is truly present  Begin Bactrim  twice daily for 7 days for treatment of a possible urinary infection as you are symptomatic  In your urine sample there was a large amount of sugar that should not be there which is concerning that your diabetes is not currently controlled therefore please schedule follow-up with your primary doctor for reevaluation  Urine pregnancy test is negative, if you do not have a period in the upcoming weeks or missed your October menstruation please take an over-the-counter pregnancy test and if you continue to miss periods please follow-up with your primary doctor  You may use over-the-counter Azo to help minimize your symptoms until antibiotic removes bacteria, this medication will turn your urine orange  Increase your fluid intake through use of water  As always practice good hygiene, wiping front to back and avoidance of scented vaginal products to prevent further irritation  If symptoms continue to persist after use of medication or recur please follow-up with urgent care or your primary doctor as needed    ED Prescriptions     Medication Sig Dispense Auth. Provider   sulfamethoxazole -trimethoprim  (BACTRIM  DS) 800-160 MG tablet Take 1 tablet by mouth 2 (two) times daily for 7 days. 14 tablet Lennis Rader R, NP      PDMP not reviewed this encounter.   Teresa Shelba SAUNDERS, NP 03/01/24 1115

## 2024-03-01 NOTE — Discharge Instructions (Addendum)
 Today you are evaluated for your urinary and abdominal symptoms  Urinalysis at this time does not show bacteria or infection, sample has been sent to the lab for the next 3 days to determine if bacteria is truly present  Begin Bactrim  twice daily for 7 days for treatment of a possible urinary infection as you are symptomatic  In your urine sample there was a large amount of sugar that should not be there which is concerning that your diabetes is not currently controlled therefore please schedule follow-up with your primary doctor for reevaluation  Urine pregnancy test is negative, if you do not have a period in the upcoming weeks or missed your October menstruation please take an over-the-counter pregnancy test and if you continue to miss periods please follow-up with your primary doctor  You may use over-the-counter Azo to help minimize your symptoms until antibiotic removes bacteria, this medication will turn your urine orange  Increase your fluid intake through use of water  As always practice good hygiene, wiping front to back and avoidance of scented vaginal products to prevent further irritation  If symptoms continue to persist after use of medication or recur please follow-up with urgent care or your primary doctor as needed

## 2024-03-01 NOTE — ED Triage Notes (Signed)
 Patient complains of lower abdominal pain, urinary frequency and painful urination that started 3 days ago. Patient reports taking Ibuprofen at 7:00 am with mild relief. Rates pain 8/10.

## 2024-03-03 ENCOUNTER — Ambulatory Visit (HOSPITAL_COMMUNITY): Payer: Self-pay

## 2024-03-03 LAB — URINE CULTURE: Culture: >=100000 — AB

## 2024-03-05 ENCOUNTER — Emergency Department

## 2024-03-05 ENCOUNTER — Other Ambulatory Visit: Payer: Self-pay

## 2024-03-05 ENCOUNTER — Inpatient Hospital Stay
Admission: EM | Admit: 2024-03-05 | Discharge: 2024-03-09 | DRG: 623 | Disposition: A | Discharge status: Home / Self Care | Attending: Internal Medicine | Admitting: Internal Medicine

## 2024-03-05 ENCOUNTER — Encounter: Payer: Self-pay | Admitting: Emergency Medicine

## 2024-03-05 DIAGNOSIS — E1065 Type 1 diabetes mellitus with hyperglycemia: Secondary | ICD-10-CM | POA: Diagnosis present

## 2024-03-05 DIAGNOSIS — L02214 Cutaneous abscess of groin: Principal | ICD-10-CM | POA: Diagnosis present

## 2024-03-05 DIAGNOSIS — L02416 Cutaneous abscess of left lower limb: Secondary | ICD-10-CM | POA: Diagnosis present

## 2024-03-05 DIAGNOSIS — D509 Iron deficiency anemia, unspecified: Secondary | ICD-10-CM | POA: Diagnosis present

## 2024-03-05 DIAGNOSIS — Z880 Allergy status to penicillin: Secondary | ICD-10-CM

## 2024-03-05 DIAGNOSIS — E10628 Type 1 diabetes mellitus with other skin complications: Secondary | ICD-10-CM | POA: Diagnosis present

## 2024-03-05 DIAGNOSIS — D508 Other iron deficiency anemias: Secondary | ICD-10-CM | POA: Diagnosis not present

## 2024-03-05 DIAGNOSIS — Z794 Long term (current) use of insulin: Secondary | ICD-10-CM

## 2024-03-05 DIAGNOSIS — L03116 Cellulitis of left lower limb: Secondary | ICD-10-CM | POA: Diagnosis present

## 2024-03-05 DIAGNOSIS — E162 Hypoglycemia, unspecified: Secondary | ICD-10-CM | POA: Diagnosis present

## 2024-03-05 DIAGNOSIS — E872 Acidosis, unspecified: Secondary | ICD-10-CM | POA: Diagnosis present

## 2024-03-05 DIAGNOSIS — L03314 Cellulitis of groin: Secondary | ICD-10-CM | POA: Diagnosis present

## 2024-03-05 DIAGNOSIS — R739 Hyperglycemia, unspecified: Secondary | ICD-10-CM

## 2024-03-05 LAB — CBC WITH DIFFERENTIAL/PLATELET
Abs Immature Granulocytes: 0.02 K/uL (ref 0.00–0.07)
Basophils Absolute: 0 K/uL (ref 0.0–0.1)
Basophils Relative: 0 %
Eosinophils Absolute: 0.1 K/uL (ref 0.0–0.5)
Eosinophils Relative: 2 %
HCT: 26.8 % — ABNORMAL LOW (ref 36.0–46.0)
Hemoglobin: 7.8 g/dL — ABNORMAL LOW (ref 12.0–15.0)
Immature Granulocytes: 0 %
Lymphocytes Relative: 14 %
Lymphs Abs: 0.8 K/uL (ref 0.7–4.0)
MCH: 21.4 pg — ABNORMAL LOW (ref 26.0–34.0)
MCHC: 29.1 g/dL — ABNORMAL LOW (ref 30.0–36.0)
MCV: 73.4 fL — ABNORMAL LOW (ref 80.0–100.0)
Monocytes Absolute: 0.5 K/uL (ref 0.1–1.0)
Monocytes Relative: 10 %
Neutro Abs: 3.8 K/uL (ref 1.7–7.7)
Neutrophils Relative %: 74 %
Platelets: 173 K/uL (ref 150–400)
RBC: 3.65 MIL/uL — ABNORMAL LOW (ref 3.87–5.11)
RDW: 17.9 % — ABNORMAL HIGH (ref 11.5–15.5)
WBC: 5.3 K/uL (ref 4.0–10.5)
nRBC: 0 % (ref 0.0–0.2)

## 2024-03-05 LAB — HEMOGLOBIN A1C
Hgb A1c MFr Bld: 9.8 % — ABNORMAL HIGH (ref 4.8–5.6)
Mean Plasma Glucose: 234.56 mg/dL

## 2024-03-05 LAB — GLUCOSE, CAPILLARY
Glucose-Capillary: 246 mg/dL — ABNORMAL HIGH (ref 70–99)
Glucose-Capillary: 219 mg/dL — ABNORMAL HIGH (ref 70–99)

## 2024-03-05 LAB — BASIC METABOLIC PANEL WITH GFR
Anion gap: 15 (ref 5–15)
BUN: 11 mg/dL (ref 6–20)
CO2: 18 mmol/L — ABNORMAL LOW (ref 22–32)
Calcium: 8.8 mg/dL — ABNORMAL LOW (ref 8.9–10.3)
Chloride: 101 mmol/L (ref 98–111)
Creatinine, Ser: 0.77 mg/dL (ref 0.44–1.00)
GFR, Estimated: >60 mL/min (ref >60)
Glucose, Bld: 416 mg/dL — ABNORMAL HIGH (ref 70–99)
Potassium: 3.6 mmol/L (ref 3.5–5.1)
Sodium: 134 mmol/L — ABNORMAL LOW (ref 135–145)

## 2024-03-05 LAB — URINALYSIS, ROUTINE W REFLEX MICROSCOPIC
Bacteria, UA: NONE SEEN
Bilirubin Urine: NEGATIVE
Glucose, UA: >=500 mg/dL — AB
Hgb urine dipstick: NEGATIVE
Ketones, ur: NEGATIVE mg/dL
Leukocytes,Ua: NEGATIVE
Nitrite: NEGATIVE
Protein, ur: NEGATIVE mg/dL
Specific Gravity, Urine: 1.028 (ref 1.005–1.030)
pH: 6 (ref 5.0–8.0)

## 2024-03-05 LAB — CBG MONITORING, ED: Glucose-Capillary: 78 mg/dL (ref 70–99)

## 2024-03-05 LAB — LACTIC ACID, PLASMA: Lactic Acid, Venous: 1.2 mmol/L (ref 0.5–1.9)

## 2024-03-05 LAB — HIV ANTIBODY (ROUTINE TESTING W REFLEX): HIV Screen 4th Generation wRfx: NONREACTIVE

## 2024-03-05 LAB — PREGNANCY, URINE: Preg Test, Ur: NEGATIVE

## 2024-03-05 MED ORDER — SODIUM CHLORIDE 0.9 % IV SOLN
100.0000 mg | Freq: Two times a day (BID) | INTRAVENOUS | Status: DC
  Administered 2024-03-05 – 2024-03-06 (×4): 100 mg via INTRAVENOUS
  Filled 2024-03-05 (×5): qty 100

## 2024-03-05 MED ORDER — INSULIN ASPART 100 UNIT/ML IJ SOLN
0.0000 [IU] | Freq: Every day | INTRAMUSCULAR | Status: DC
  Administered 2024-03-05 – 2024-03-07 (×2): 2 [IU] via SUBCUTANEOUS
  Filled 2024-03-05 (×2): qty 1

## 2024-03-05 MED ORDER — ACETAMINOPHEN 325 MG PO TABS
650.0000 mg | ORAL_TABLET | Freq: Four times a day (QID) | ORAL | Status: DC | PRN
  Filled 2024-03-05: qty 2

## 2024-03-05 MED ORDER — MORPHINE SULFATE (PF) 2 MG/ML IV SOLN
2.0000 mg | INTRAVENOUS | Status: DC | PRN
  Administered 2024-03-06 – 2024-03-08 (×8): 2 mg via INTRAVENOUS
  Filled 2024-03-05 (×9): qty 1

## 2024-03-05 MED ORDER — SODIUM CHLORIDE 0.9 % IV BOLUS
1000.0000 mL | Freq: Once | INTRAVENOUS | Status: AC
  Administered 2024-03-05: 1000 mL via INTRAVENOUS

## 2024-03-05 MED ORDER — ONDANSETRON HCL 4 MG PO TABS: 4.0000 mg | ORAL_TABLET | Freq: Four times a day (QID) | ORAL | Status: DC | PRN

## 2024-03-05 MED ORDER — ONDANSETRON HCL 4 MG/2ML IJ SOLN
4.0000 mg | Freq: Once | INTRAMUSCULAR | Status: AC
  Administered 2024-03-05: 4 mg via INTRAVENOUS
  Filled 2024-03-05: qty 2

## 2024-03-05 MED ORDER — MORPHINE SULFATE (PF) 4 MG/ML IV SOLN
4.0000 mg | Freq: Once | INTRAVENOUS | Status: AC
  Administered 2024-03-05: 4 mg via INTRAVENOUS
  Filled 2024-03-05: qty 1

## 2024-03-05 MED ORDER — INSULIN ASPART 100 UNIT/ML IJ SOLN
0.0000 [IU] | Freq: Three times a day (TID) | INTRAMUSCULAR | Status: DC
  Administered 2024-03-05 – 2024-03-06 (×2): 5 [IU] via SUBCUTANEOUS
  Filled 2024-03-05 (×2): qty 1

## 2024-03-05 MED ORDER — KETOROLAC TROMETHAMINE 30 MG/ML IJ SOLN
30.0000 mg | Freq: Four times a day (QID) | INTRAMUSCULAR | Status: DC | PRN
  Administered 2024-03-05 – 2024-03-08 (×6): 30 mg via INTRAVENOUS
  Filled 2024-03-05 (×6): qty 1

## 2024-03-05 MED ORDER — ENOXAPARIN SODIUM 40 MG/0.4ML IJ SOSY
40.0000 mg | PREFILLED_SYRINGE | INTRAMUSCULAR | Status: DC
  Administered 2024-03-05 – 2024-03-08 (×4): 40 mg via SUBCUTANEOUS
  Filled 2024-03-05 (×4): qty 0.4

## 2024-03-05 MED ORDER — IOHEXOL 300 MG/ML  SOLN
100.0000 mL | Freq: Once | INTRAMUSCULAR | Status: AC | PRN
Start: 2024-03-05 — End: 2024-03-05
  Administered 2024-03-05: 100 mL via INTRAVENOUS

## 2024-03-05 MED ORDER — ONDANSETRON HCL 4 MG/2ML IJ SOLN: 4.0000 mg | Freq: Four times a day (QID) | INTRAMUSCULAR | Status: DC | PRN

## 2024-03-05 MED ORDER — INSULIN GLARGINE 100 UNIT/ML ~~LOC~~ SOLN
10.0000 [IU] | Freq: Every day | SUBCUTANEOUS | Status: DC
Start: 2024-03-05 — End: 2024-03-06
  Administered 2024-03-05 – 2024-03-06 (×2): 10 [IU] via SUBCUTANEOUS
  Filled 2024-03-05 (×2): qty 0.1

## 2024-03-05 MED ORDER — SODIUM CHLORIDE 0.9 % IV SOLN
2.0000 g | Freq: Three times a day (TID) | INTRAVENOUS | Status: DC
  Administered 2024-03-05 – 2024-03-06 (×5): 2 g via INTRAVENOUS
  Filled 2024-03-05 (×7): qty 12.5

## 2024-03-05 MED ORDER — INSULIN GLARGINE 100 UNITS/ML SOLOSTAR PEN: 10.0000 [IU] | PEN_INJECTOR | SUBCUTANEOUS | Status: DC

## 2024-03-05 MED ORDER — FENTANYL CITRATE (PF) 50 MCG/ML IJ SOSY
50.0000 ug | PREFILLED_SYRINGE | Freq: Once | INTRAMUSCULAR | Status: AC
  Administered 2024-03-05: 50 ug via INTRAVENOUS
  Filled 2024-03-05: qty 1

## 2024-03-05 MED ORDER — CLINDAMYCIN PHOSPHATE 600 MG/50ML IV SOLN
600.0000 mg | Freq: Once | INTRAVENOUS | Status: AC
  Administered 2024-03-05: 600 mg via INTRAVENOUS
  Filled 2024-03-05: qty 50

## 2024-03-05 MED ORDER — ACETAMINOPHEN 650 MG RE SUPP: 650.0000 mg | Freq: Four times a day (QID) | RECTAL | Status: DC | PRN

## 2024-03-05 NOTE — H&P (Signed)
 History and Physical    Rebecca Callahan FMW:[id number] DOB: March 25, 1996 DOA: 03/05/2024  PCP: Hersey Norleen ORN, MD (Confirm with patient/family/NH records and if not entered, this has to be entered at [facility] point of entry) Patient coming from: Home  I have personally briefly reviewed patient's old medical records in [facility]  Chief Complaint: Left groin pain and swelling  HPI: Rebecca Callahan is a 28 y.o. female with medical history significant of IDDM came in with worsening of left groin swelling and pain.  Patient started develop a pimple on the left groin area 3 days ago, gradually getting worse.  At the same time, patient has been taking Bactrim  empirically for a UTI for the last 3 days.  Denies any fever or chills.  ED Course: Afebrile, no tachycardia blood pressure 112/70.  Blood work showed glucose 416, WBC 5.9 hemoglobin 7.8 BUN 11 creatinine 0.7 bicarb 18.  CT abdominal pelvis showed left groin cellulitis.  Ultrasound showed less than 1 cm diameter of small abscess.  Patient was given clindamycin and ceftriaxone  in the ED.  Review of Systems: As per HPI otherwise 14 point review of systems negative.    Past Medical History:  Diagnosis Date   Diabetes mellitus without complication (HCC)     History reviewed. No pertinent surgical history.   reports that she has never smoked. She has never used smokeless tobacco. She reports current alcohol use. She reports current drug use. Drug: Marijuana.  Allergies  Allergen Reactions   Amoxicillin Rash    History reviewed. No pertinent family history.   Prior to Admission medications   Medication Sig Start Date End Date Taking? Authorizing Provider  Blood Glucose Monitoring Suppl DEVI 1 each by Does not apply route in the morning, at noon, and at bedtime. May substitute to any manufacturer covered by patient's insurance. 08/09/22   Patel, Sona, MD  cefUROXime  (CEFTIN ) 250 MG tablet Take 1 tablet (250 mg  total) by mouth 2 (two) times daily with a meal. 12/09/23   Bradler, Evan K, MD  Glucose Blood (BLOOD GLUCOSE TEST STRIPS) STRP 1 each by In Vitro route in the morning, at noon, and at bedtime. May substitute to any manufacturer covered by patient's insurance. 08/09/22   Patel, Sona, MD  Insulin  Lispro Prot & Lispro (HUMALOG  75/25 MIX) (75-25) 100 UNIT/ML Kwikpen Inject 14 Units into the skin 2 (two) times daily with a meal. 08/09/22   Tobie Calix, MD  Insulin  Pen Needle 32G X 4 MM MISC use with humalog  kwikpen mix 75/25 twice daily 08/09/22   Patel, Sona, MD  Insulin  Pen Needle 32G X 4 MM MISC use with humalog  kwikpen mix 75/25 twice daily 08/09/22   Patel, Sona, MD  phenazopyridine  (PYRIDIUM ) 97 MG tablet Take 1 tablet (97 mg total) by mouth 3 (three) times daily as needed for pain. 12/09/23   Bradler, Evan K, MD  sulfamethoxazole -trimethoprim  (BACTRIM  DS) 800-160 MG tablet Take 1 tablet by mouth 2 (two) times daily for 7 days. 03/01/24 03/08/24  Teresa Shelba SAUNDERS, NP    Physical Exam: Vitals:   03/05/24 0640 03/05/24 0641 03/05/24 1104  BP: 129/78  112/70  Pulse: 96  81  Resp: 16  19  Temp: 98.3 F (36.8 C)  97.8 F (36.6 C)  TempSrc: Oral  Oral  SpO2: 99%  100%  Weight:  72.1 kg   Height:  5' 8 (1.727 m)     Constitutional: NAD, calm, comfortable Vitals:   03/05/24 9359 03/05/24 9358  03/05/24 1104  BP: 129/78  112/70  Pulse: 96  81  Resp: 16  19  Temp: 98.3 F (36.8 C)  97.8 F (36.6 C)  TempSrc: Oral  Oral  SpO2: 99%  100%  Weight:  72.1 kg   Height:  5' 8 (1.727 m)    Eyes: PERRL, lids and conjunctivae normal ENMT: Mucous membranes are moist. Posterior pharynx clear of any exudate or lesions.Normal dentition.  Neck: normal, supple, no masses, no thyromegaly Respiratory: clear to auscultation bilaterally, no wheezing, no crackles. Normal respiratory effort. No accessory muscle use.  Cardiovascular: Regular rate and rhythm, no murmurs / rubs / gallops. No extremity edema. 2+  pedal pulses. No carotid bruits.  Abdomen: no tenderness, no masses palpated. No hepatosplenomegaly. Bowel sounds positive.  Musculoskeletal: no clubbing / cyanosis. No joint deformity upper and lower extremities. Good ROM, no contractures. Normal muscle tone.  Skin: Left groin swelling and tenderness. Neurologic: CN 2-12 grossly intact. Sensation intact, DTR normal. Strength 5/5 in all 4.  Psychiatric: Normal judgment and insight. Alert and oriented x 3. Normal mood.    Labs on Admission: I have personally reviewed following labs and imaging studies  CBC: Recent Labs  Lab 03/05/24 0746  WBC 5.3  NEUTROABS 3.8  HGB 7.8*  HCT 26.8*  MCV 73.4*  PLT 173   Basic Metabolic Panel: Recent Labs  Lab 03/05/24 0746  NA 134*  K 3.6  CL 101  CO2 18*  GLUCOSE 416*  BUN 11  CREATININE 0.77  CALCIUM 8.8*   GFR: Estimated Creatinine Clearance: 104.7 mL/min (by C-G formula based on SCr of 0.77 mg/dL). Liver Function Tests: No results for input(s): AST, ALT, ALKPHOS, BILITOT, PROT, ALBUMIN in the last 168 hours. No results for input(s): LIPASE, AMYLASE in the last 168 hours. No results for input(s): AMMONIA in the last 168 hours. Coagulation Profile: No results for input(s): INR, PROTIME in the last 168 hours. Cardiac Enzymes: No results for input(s): CKTOTAL, CKMB, CKMBINDEX, TROPONINI in the last 168 hours. BNP (last 3 results) No results for input(s): PROBNP in the last 8760 hours. HbA1C: No results for input(s): HGBA1C in the last 72 hours. CBG: Recent Labs  Lab 03/05/24 1136  GLUCAP 78   Lipid Profile: No results for input(s): CHOL, HDL, LDLCALC, TRIG, CHOLHDL, LDLDIRECT in the last 72 hours. Thyroid Function Tests: No results for input(s): TSH, T4TOTAL, FREET4, T3FREE, THYROIDAB in the last 72 hours. Anemia Panel: No results for input(s): VITAMINB12, FOLATE, FERRITIN, TIBC, IRON, RETICCTPCT in the last  72 hours. Urine analysis:    Component Value Date/Time   COLORURINE STRAW (A) 03/05/2024 0756   APPEARANCEUR CLEAR (A) 03/05/2024 0756   APPEARANCEUR Cloudy 07/26/2013 1221   LABSPEC 1.028 03/05/2024 0756   LABSPEC 1.027 07/26/2013 1221   PHURINE 6.0 03/05/2024 0756   GLUCOSEU >=500 (A) 03/05/2024 0756   GLUCOSEU Negative 07/26/2013 1221   HGBUR NEGATIVE 03/05/2024 0756   BILIRUBINUR NEGATIVE 03/05/2024 0756   BILIRUBINUR small (A) 03/01/2024 1043   BILIRUBINUR Negative 07/26/2013 1221   KETONESUR NEGATIVE 03/05/2024 0756   PROTEINUR NEGATIVE 03/05/2024 0756   UROBILINOGEN 0.2 03/01/2024 1043   NITRITE NEGATIVE 03/05/2024 0756   LEUKOCYTESUR NEGATIVE 03/05/2024 0756   LEUKOCYTESUR 1+ 07/26/2013 1221    Radiological Exams on Admission: CT PELVIS W CONTRAST Result Date: 03/05/2024 EXAM: CT Pelvis, With IV Contrast 03/05/2024 10:12:56 AM TECHNIQUE: Axial images were acquired through the pelvis with IV contrast. iohexol  (OMNIPAQUE ) 300 MG/ML solution was administered intravenously. Reformatted images  were reviewed. Automated exposure control, iterative reconstruction, and/or weight based adjustment of the mA/kV was utilized to reduce the radiation dose to as low as reasonably achievable. COMPARISON: CT of the abdomen and pelvis dated 12/09/2023. CLINICAL HISTORY: abscess, cellulitis left upper leg and pubis. Abscess abscess, cellulitis left upper leg and pubis. Abscess FINDINGS: BONES: No acute fracture or focal osseous lesion. JOINTS: No dislocation. The joint spaces are normal. SOFT TISSUES: Mild thickening of the skin present anteromedially within the proximal thigh with stranding of the underlying subcutaneous fat. There is no appreciable fluid collection. There is no appreciable involvement of the mons pubis or of the adductor musculature of the left hip. There are several shotty inguinal lymph nodes present bilaterally, more pronounced on the left, which have increased in size  since the previous study and measure up to approximately 18 mm in long axis. INTRAPELVIC CONTENTS: The pelvic viscera are unremarkable. IMPRESSION: 1. Mild thickening of the skin anteromedially within the proximal thigh with stranding of the underlying subcutaneous fat, without appreciable fluid collection. Findings are compatible with cellulitis. 2. Several shotty inguinal lymph nodes bilaterally, more pronounced on the left, which have increased in size since the previous study and measure up to approximately 18 mm in long axis. Electronically signed by: Evalene Coho MD 03/05/2024 10:44 AM EDT RP Workstation: [id number]   US  LT LOWER EXTREM LTD SOFT TISSUE NON VASCULAR Result Date: 03/05/2024 CLINICAL DATA:  Cellulitis EXAM: ULTRASOUND LEFT LOWER EXTREMITY LIMITED TECHNIQUE: Ultrasound examination of the lower extremity soft tissues was performed in the area of clinical concern. COMPARISON:  None Available. FINDINGS: Focused ultrasound exam was performed in the region of patient concern over the groin area. This reveals subcutaneous edema and a small complex fluid collection measuring 9 x 9 x 8 mm. Color Doppler assessment shows hyperemia in the soft tissues adjacent to the collection. IMPRESSION: 9 x 9 x 8 mm complex fluid collection in the region of patient concern. Imaging features are compatible with a small abscess on a background of cellulitis. Close follow-up recommended and follow-up CT or MRI with contrast could be used to more definitively characterize as clinically warranted. Electronically Signed   By: Camellia Candle M.D.   On: 03/05/2024 09:16    EKG: None  Assessment/Plan Principal Problem:   Groin abscess Active Problems:   Abscess of groin, right  (please populate well all problems here in Problem List. (For example, if patient is on BP meds at home and you resume or decide to hold them, it is a problem that needs to be her. Same for CAD, COPD, HLD and so on)   Left groin  cellulitis and abscess - Low suspicion for Fournier's gangrene at this point, given the clinical finding and imaging study.  Plan to continue broad-spectrum antibiotics, increased coverage for Pseudomonas and patient has had poorly controlled diabetes. - Start doxycycline , start cefepime - Check MRSA screen - Pain control  IDDM with hyperglycemia - Lantus 10 units daily - SSI  DVT prophylaxis: Lovenox  Code Status: Full code Family Communication: None at bedside Disposition Plan: Patient is sick with left groin abscess requiring IV antibiotics, expect more than 2 midnight hospital stay Consults called: None Admission status: MedSurg admission   Cort ONEIDA Mana MD [facility] Pager [phone]  03/05/2024, 12:11 PM

## 2024-03-05 NOTE — ED Triage Notes (Addendum)
 Pt arrives POV, ambulatory to triage, gait steady, no acute distress noted c/o left inner thigh abscess x 3 days. Pt took 800 mg ibuprofen around 0315 this morning.

## 2024-03-05 NOTE — ED Provider Notes (Signed)
 [facility] Provider Note    Event Date/Time   First MD Initiated Contact with Patient 03/05/24 [phone]     (approximate)   History   Abscess   HPI  Rebecca Callahan is a 28 y.o. female history of diabetes, presents emergency department with a swollen red area on the left upper thigh.  Patient states looked like a small abscess that popped and drained but now the large amount of swelling, redness and pain have increased.  States difficult to walk due to the amount of swelling.  Denies fever or chills.  No vomiting.  Symptoms have been ongoing for 3 days.      Physical Exam   Triage Vital Signs: ED Triage Vitals  Encounter Vitals Group     BP 03/05/24 0640 129/78     Girls Systolic BP Percentile --      Girls Diastolic BP Percentile --      Boys Systolic BP Percentile --      Boys Diastolic BP Percentile --      Pulse Rate 03/05/24 0640 96     Resp 03/05/24 0640 16     Temp 03/05/24 0640 98.3 F (36.8 C)     Temp Source 03/05/24 0640 Oral     SpO2 03/05/24 0640 99 %     Weight 03/05/24 0641 159 lb (72.1 kg)     Height 03/05/24 0641 5' 8 (1.727 m)     Head Circumference --      Peak Flow --      Pain Score 03/05/24 0641 9     Pain Loc --      Pain Education --      Exclude from Growth Chart --     Most recent vital signs: Vitals:   03/05/24 0640 03/05/24 1104  BP: 129/78 112/70  Pulse: 96 81  Resp: 16 19  Temp: 98.3 F (36.8 C) 97.8 F (36.6 C)  SpO2: 99% 100%     General: Awake, no distress.   CV:  Good peripheral perfusion. Resp:  Normal effort.  Abd:  No distention.   Other:  Left thigh with large red area noted, hard, indurated, no fluctuance noted, very tender to palpation   ED Results / Procedures / Treatments   Labs (all labs ordered are listed, but only abnormal results are displayed) Labs Reviewed  BASIC METABOLIC PANEL WITH GFR - Abnormal; Notable for the following components:      Result Value   Sodium  134 (*)    CO2 18 (*)    Glucose, Bld 416 (*)    Calcium 8.8 (*)    All other components within normal limits  CBC WITH DIFFERENTIAL/PLATELET - Abnormal; Notable for the following components:   RBC 3.65 (*)    Hemoglobin 7.8 (*)    HCT 26.8 (*)    MCV 73.4 (*)    MCH 21.4 (*)    MCHC 29.1 (*)    RDW 17.9 (*)    All other components within normal limits  URINALYSIS, ROUTINE W REFLEX MICROSCOPIC - Abnormal; Notable for the following components:   Color, Urine STRAW (*)    APPearance CLEAR (*)    Glucose, UA >=500 (*)    All other components within normal limits  CULTURE, BLOOD (ROUTINE X 2)  CULTURE, BLOOD (ROUTINE X 2)  MRSA NEXT GEN BY PCR, NASAL  PREGNANCY, URINE  LACTIC ACID, PLASMA  LACTIC ACID, PLASMA  HIV ANTIBODY (ROUTINE TESTING W REFLEX)  HEMOGLOBIN A1C  CBG MONITORING, ED     EKG     RADIOLOGY Ultrasound left upper extremity, CT of the pelvis with contrast    PROCEDURES:   Procedures  Critical Care:  no Chief Complaint  Patient presents with   Abscess      MEDICATIONS ORDERED IN ED: Medications  doxycycline  (VIBRAMYCIN ) 100 mg in sodium chloride  0.9 % 250 mL IVPB (100 mg Intravenous New Bag/Given 03/05/24 1243)  enoxaparin  (LOVENOX ) injection 40 mg (40 mg Subcutaneous Given 03/05/24 1239)  acetaminophen  (TYLENOL ) tablet 650 mg (has no administration in time range)    Or  acetaminophen  (TYLENOL ) suppository 650 mg (has no administration in time range)  ondansetron  (ZOFRAN ) tablet 4 mg (has no administration in time range)    Or  ondansetron  (ZOFRAN ) injection 4 mg (has no administration in time range)  ketorolac  (TORADOL ) 30 MG/ML injection 30 mg (has no administration in time range)  insulin  aspart (novoLOG ) injection 0-15 Units (has no administration in time range)  insulin  aspart (novoLOG ) injection 0-5 Units (has no administration in time range)  insulin  glargine (LANTUS) injection 10 Units (10 Units Subcutaneous Given 03/05/24 1238)   ceFEPIme (MAXIPIME) 2 g in sodium chloride  0.9 % 100 mL IVPB (has no administration in time range)  sodium chloride  0.9 % bolus 1,000 mL (0 mLs Intravenous Stopped 03/05/24 1016)  morphine  (PF) 4 MG/ML injection 4 mg (4 mg Intravenous Given 03/05/24 0753)  ondansetron  (ZOFRAN ) injection 4 mg (4 mg Intravenous Given 03/05/24 0752)  clindamycin (CLEOCIN) IVPB 600 mg (0 mg Intravenous Stopped 03/05/24 0848)  fentaNYL (SUBLIMAZE) injection 50 mcg (50 mcg Intravenous Given 03/05/24 0936)  iohexol  (OMNIPAQUE ) 300 MG/ML solution 100 mL (100 mLs Intravenous Contrast Given 03/05/24 1002)     IMPRESSION / MDM / ASSESSMENT AND PLAN / ED COURSE  I reviewed the triage vital signs and the nursing notes.                              Differential diagnosis includes, but is not limited to, cellulitis, abscess, sepsis  Patient's presentation is most consistent with acute illness / injury with system symptoms.    Medications given: Clindamycin 600 mg IV, morphine  4 mg IV for pain, Zofran  4 mg for nausea associated with location  Labs, imaging, IV fluids and clindamycin ordered   CBC shows anemia which appears to be in the patient's normal trend, basic metabolic panel shows increased glucose of 416, patient is diabetic and states she is taking her medication.  Due to the amount of redness, tenderness I did order an ultrasound.  They see a scant amount of fluid.  Recommend CT or MRI.  This is independently reviewed interpreted by me  Independent review and interpretation of the CT of the pelvis shows large area of cellulitis, some swollen lymph nodes, no noted abscess or necrotic area.  Due to the patient's diabetes, area of concern being very painful and very infected do feel that she would benefit from admission to the hospital.  Patient is in agreement to be admitted to the hospital.  Did discuss this with Dr. Arlander.  He is in agreement she should be admitted.  Spoke with Dr. Laurita, he is agreeable to  admit the patient.  She is in stable condition at time of admission.   FINAL CLINICAL IMPRESSION(S) / ED DIAGNOSES   Final diagnoses:  Cellulitis of left leg  Hyperglycemia     Rx /  DC Orders   ED Discharge Orders     None        Note:  This document was prepared using Dragon voice recognition software and may include unintentional dictation errors.    Gasper Devere ORN, PA-C 03/05/24 1250    Arlander [place], MD 03/05/24 1300

## 2024-03-06 ENCOUNTER — Other Ambulatory Visit (HOSPITAL_COMMUNITY): Payer: Self-pay

## 2024-03-06 ENCOUNTER — Telehealth (HOSPITAL_COMMUNITY): Payer: Self-pay

## 2024-03-06 DIAGNOSIS — E1065 Type 1 diabetes mellitus with hyperglycemia: Secondary | ICD-10-CM

## 2024-03-06 DIAGNOSIS — L02214 Cutaneous abscess of groin: Secondary | ICD-10-CM | POA: Diagnosis not present

## 2024-03-06 DIAGNOSIS — D508 Other iron deficiency anemias: Secondary | ICD-10-CM

## 2024-03-06 DIAGNOSIS — L02416 Cutaneous abscess of left lower limb: Secondary | ICD-10-CM

## 2024-03-06 DIAGNOSIS — D509 Iron deficiency anemia, unspecified: Secondary | ICD-10-CM | POA: Insufficient documentation

## 2024-03-06 LAB — CBC
HCT: 24 % — ABNORMAL LOW (ref 36.0–46.0)
Hemoglobin: 7.1 g/dL — ABNORMAL LOW (ref 12.0–15.0)
MCH: 21.8 pg — ABNORMAL LOW (ref 26.0–34.0)
MCHC: 29.6 g/dL — ABNORMAL LOW (ref 30.0–36.0)
MCV: 73.8 fL — ABNORMAL LOW (ref 80.0–100.0)
Platelets: 161 K/uL (ref 150–400)
RBC: 3.25 MIL/uL — ABNORMAL LOW (ref 3.87–5.11)
RDW: 17.9 % — ABNORMAL HIGH (ref 11.5–15.5)
WBC: 4.4 K/uL (ref 4.0–10.5)
nRBC: 0 % (ref 0.0–0.2)

## 2024-03-06 LAB — IRON AND TIBC
Iron: 33 ug/dL (ref 28–170)
Saturation Ratios: 8 % — ABNORMAL LOW (ref 10.4–31.8)
TIBC: 412 ug/dL (ref 250–450)
UIBC: 379 ug/dL

## 2024-03-06 LAB — GLUCOSE, CAPILLARY
Glucose-Capillary: 185 mg/dL — ABNORMAL HIGH (ref 70–99)
Glucose-Capillary: 206 mg/dL — ABNORMAL HIGH (ref 70–99)
Glucose-Capillary: 250 mg/dL — ABNORMAL HIGH (ref 70–99)
Glucose-Capillary: 61 mg/dL — ABNORMAL LOW (ref 70–99)
Glucose-Capillary: 62 mg/dL — ABNORMAL LOW (ref 70–99)
Glucose-Capillary: 70 mg/dL (ref 70–99)
Glucose-Capillary: 83 mg/dL (ref 70–99)
Glucose-Capillary: 90 mg/dL (ref 70–99)

## 2024-03-06 LAB — VITAMIN B12: Vitamin B-12: 183 pg/mL (ref 180–914)

## 2024-03-06 LAB — FERRITIN: Ferritin: 6 ng/mL — ABNORMAL LOW (ref 11–307)

## 2024-03-06 MED ORDER — DEXTROSE 50 % IV SOLN
1.0000 | Freq: Once | INTRAVENOUS | Status: DC
Start: 2024-03-07 — End: 2024-03-09

## 2024-03-06 MED ORDER — INSULIN GLARGINE 100 UNIT/ML ~~LOC~~ SOLN
18.0000 [IU] | Freq: Every day | SUBCUTANEOUS | Status: DC
  Filled 2024-03-06: qty 0.18

## 2024-03-06 MED ORDER — POLYSACCHARIDE IRON COMPLEX 150 MG PO CAPS
150.0000 mg | ORAL_CAPSULE | Freq: Every day | ORAL | Status: DC
Start: 2024-03-06 — End: 2024-03-09
  Administered 2024-03-06 – 2024-03-09 (×4): 150 mg via ORAL
  Filled 2024-03-06 (×4): qty 1

## 2024-03-06 MED ORDER — INSULIN ASPART 100 UNIT/ML IJ SOLN: 4.0000 [IU] | Freq: Three times a day (TID) | INTRAMUSCULAR | Status: DC

## 2024-03-06 MED ORDER — CYANOCOBALAMIN 1000 MCG/ML IJ SOLN
1000.0000 ug | Freq: Once | INTRAMUSCULAR | Status: AC
  Administered 2024-03-06: 1000 ug via INTRAMUSCULAR
  Filled 2024-03-06: qty 1

## 2024-03-06 MED ORDER — CHLORHEXIDINE GLUCONATE CLOTH 2 % EX PADS
6.0000 | MEDICATED_PAD | Freq: Every day | CUTANEOUS | Status: DC
  Administered 2024-03-07 – 2024-03-09 (×2): 6 via TOPICAL

## 2024-03-06 NOTE — Consult Note (Signed)
 Patient ID: Rebecca Callahan, female   DOB: 10-19-1995, 28 y.o.   MRN: [id number]  HPI Rebecca Callahan is a 28 y.o. female seen in consultation at the request of Dr. Laurita, case discussed with him.  She started with lower abdominal pain and upper thigh pain for the last 6 days or so.  Pain is intermittent moderate in intensity and sharp in nature.  Seems to be worsening with certain movements and when walking .  She was initially treated as an outpatient with Bactrim  and then came back to the emergency room due to worsening symptoms.  She was found to have sugars of greater than 500 and a CT scan was performed which I have personally reviewed showing evidence of cellulitis in the left inner thigh without evidence of definitive abscess or necrotizing infection. SHe seems to have diabetes that is uncontrolled. Blood work showed glucose 416, WBC 5.9 hemoglobin 7.8 BUN 11 creatinine 0.7 bicarb 18.  I was asked to her as her clinical exam seems to be worsening  HPI  Past Medical History:  Diagnosis Date   Diabetes mellitus without complication (HCC)     History reviewed. No pertinent surgical history.  History reviewed. No pertinent family history.  Social History Social History   Tobacco Use   Smoking status: Never   Smokeless tobacco: Never  Substance Use Topics   Alcohol use: Yes   Drug use: Yes    Types: Marijuana    Allergies  Allergen Reactions   Amoxicillin Rash    Current Facility-Administered Medications  Medication Dose Route Frequency Provider Last Rate Last Admin   acetaminophen  (TYLENOL ) tablet 650 mg  650 mg Oral Q6H PRN Laurita Cort DASEN, MD       Or   acetaminophen  (TYLENOL ) suppository 650 mg  650 mg Rectal Q6H PRN Laurita Cort DASEN, MD       ceFEPIme (MAXIPIME) 2 g in sodium chloride  0.9 % 100 mL IVPB  2 g Intravenous Q8H Chappell, Alex B, RPH 200 mL/hr at 03/06/24 0508 2 g at 03/06/24 9491   cyanocobalamin (VITAMIN B12) injection 1,000 mcg  1,000 mcg  Intramuscular Once Zhang, Dekui, MD       doxycycline  (VIBRAMYCIN ) 100 mg in sodium chloride  0.9 % 250 mL IVPB  100 mg Intravenous Q12H Laurita Cort DASEN, MD   Stopped at 03/06/24 0330   enoxaparin  (LOVENOX ) injection 40 mg  40 mg Subcutaneous Q24H Laurita Cort T, MD   40 mg at 03/05/24 1239   insulin  aspart (novoLOG ) injection 0-15 Units  0-15 Units Subcutaneous TID WC Zhang, Ping T, MD   5 Units at 03/06/24 9081   insulin  aspart (novoLOG ) injection 0-5 Units  0-5 Units Subcutaneous QHS Laurita Cort DASEN, MD   2 Units at 03/05/24 2154   insulin  aspart (novoLOG ) injection 4 Units  4 Units Subcutaneous TID WC Zhang, Dekui, MD       NOREEN ON 03/07/2024] insulin  glargine (LANTUS) injection 18 Units  18 Units Subcutaneous Daily Laurita Pillion, MD       iron polysaccharides (NIFEREX) capsule 150 mg  150 mg Oral Daily Zhang, Dekui, MD       ketorolac  (TORADOL ) 30 MG/ML injection 30 mg  30 mg Intravenous Q6H PRN Laurita Cort T, MD   30 mg at 03/06/24 0918   morphine  (PF) 2 MG/ML injection 2 mg  2 mg Intravenous Q4H PRN Mansy, Jan A, MD   2 mg at 03/06/24 0557   ondansetron  (ZOFRAN ) tablet 4 mg  4 mg Oral Q6H PRN Laurita Cort DASEN, MD       Or   ondansetron  (ZOFRAN ) injection 4 mg  4 mg Intravenous Q6H PRN Laurita Cort DASEN, MD         Review of Systems Full ROS  was asked and was negative except for the information on the HPI  Physical Exam Blood pressure 110/77, pulse 86, temperature 98.4 F (36.9 C), temperature source Oral, resp. rate 18, height 5' 8 (1.727 m), weight 72.1 kg, last menstrual period 01/22/2024, SpO2 94%. CONSTITUTIONAL: NAD chaperone present. EYES: Pupils are equal, round, Sclera are non-icteric. EARS, NOSE, MOUTH AND THROAT: The oropharynx is clear. The oral mucosa is pink and moist. Hearing is intact to voice. LYMPH NODES:  Lymph nodes in the neck are normal. RESPIRATORY:  Lungs are clear. There is normal respiratory effort, with equal breath sounds bilaterally, and without pathologic use of  accessory muscles. CARDIOVASCULAR: Heart is regular without murmurs, gallops, or rubs. GI: The abdomen is  soft, nontender, and nondistended. There are no palpable masses. There is no hepatosplenomegaly. There are normal bowel sounds in all quadrants. GU: Rectal deferred.   MUSCULOSKELETAL: Normal muscle strength and tone. No cyanosis or edema.   SKIN: there is erythema and fluctuance on LEft inner thigh,c/w abscess, no necrotizing infection NEUROLOGIC: Motor and sensation is grossly normal. Cranial nerves are grossly intact. PSYCH:  Oriented to person, place and time. Affect is normal.  Data Reviewed I have personally reviewed the patient's imaging, laboratory findings and medical records.    Assessment/Plan 28 year old female uncontrolled diabetes presents with cellulitis and abscess in the left thigh not responsive to antibiotic therapy.  Discussed with the patient in detail about her disease process.  She will definitely need a formal I&D.  She is too tender at the bedside to attempt a local I&D.  I think if she is better served with a formal I&D in the OR with anesthesia.  Procedure discussed with her in detail.  There is the benefits and the possible risks including but not limited to: Bleeding, infection recurrence, deformity and chronic pain she understands and wished to proceed I personally spent a total of 75 minutes in the care of the patient today including performing a medically appropriate exam/evaluation, counseling and educating, placing orders, referring and communicating with other health care professionals, documenting clinical information in the EHR, independently interpreting and reviewing images studies and coordinating care.    Laneta Luna, MD FACS General Surgeon 03/06/2024, 1:33 PM

## 2024-03-06 NOTE — Plan of Care (Signed)

## 2024-03-06 NOTE — Hospital Course (Addendum)
 Rebecca Callahan is a 28 y.o. female with medical history significant of IDDM came in with worsening of left groin swelling and pain.  CT scan showed a groin cellulitis with less than 1 cm of abscess.  Patient was started on cefepime and doxycycline . I&D performed 10/17.

## 2024-03-06 NOTE — H&P (View-Only) (Signed)
 Patient ID: [name], female   DOB: 10-19-1995, 28 y.o.   MRN: [id number]  HPI JEYLA BULGER is a 28 y.o. female seen in consultation at the request of Dr. Laurita, case discussed with him.  She started with lower abdominal pain and upper thigh pain for the last 6 days or so.  Pain is intermittent moderate in intensity and sharp in nature.  Seems to be worsening with certain movements and when walking .  She was initially treated as an outpatient with Bactrim  and then came back to the emergency room due to worsening symptoms.  She was found to have sugars of greater than 500 and a CT scan was performed which I have personally reviewed showing evidence of cellulitis in the left inner thigh without evidence of definitive abscess or necrotizing infection. SHe seems to have diabetes that is uncontrolled. Blood work showed glucose 416, WBC 5.9 hemoglobin 7.8 BUN 11 creatinine 0.7 bicarb 18.  I was asked to her as her clinical exam seems to be worsening  HPI  Past Medical History:  Diagnosis Date   Diabetes mellitus without complication (HCC)     History reviewed. No pertinent surgical history.  History reviewed. No pertinent family history.  Social History Social History   Tobacco Use   Smoking status: Never   Smokeless tobacco: Never  Substance Use Topics   Alcohol use: Yes   Drug use: Yes    Types: Marijuana    Allergies  Allergen Reactions   Amoxicillin Rash    Current Facility-Administered Medications  Medication Dose Route Frequency Provider Last Rate Last Admin   acetaminophen  (TYLENOL ) tablet 650 mg  650 mg Oral Q6H PRN Laurita Cort DASEN, MD       Or   acetaminophen  (TYLENOL ) suppository 650 mg  650 mg Rectal Q6H PRN Laurita Cort DASEN, MD       ceFEPIme (MAXIPIME) 2 g in sodium chloride  0.9 % 100 mL IVPB  2 g Intravenous Q8H Chappell, Alex B, RPH 200 mL/hr at 03/06/24 0508 2 g at 03/06/24 9491   cyanocobalamin (VITAMIN B12) injection 1,000 mcg  1,000 mcg  Intramuscular Once Zhang, Dekui, MD       doxycycline  (VIBRAMYCIN ) 100 mg in sodium chloride  0.9 % 250 mL IVPB  100 mg Intravenous Q12H Laurita Cort DASEN, MD   Stopped at 03/06/24 0330   enoxaparin  (LOVENOX ) injection 40 mg  40 mg Subcutaneous Q24H Laurita Cort T, MD   40 mg at 03/05/24 1239   insulin  aspart (novoLOG ) injection 0-15 Units  0-15 Units Subcutaneous TID WC Zhang, Ping T, MD   5 Units at 03/06/24 9081   insulin  aspart (novoLOG ) injection 0-5 Units  0-5 Units Subcutaneous QHS Laurita Cort DASEN, MD   2 Units at 03/05/24 2154   insulin  aspart (novoLOG ) injection 4 Units  4 Units Subcutaneous TID WC Zhang, Dekui, MD       NOREEN ON 03/07/2024] insulin  glargine (LANTUS) injection 18 Units  18 Units Subcutaneous Daily Laurita Pillion, MD       iron polysaccharides (NIFEREX) capsule 150 mg  150 mg Oral Daily Zhang, Dekui, MD       ketorolac  (TORADOL ) 30 MG/ML injection 30 mg  30 mg Intravenous Q6H PRN Laurita Cort T, MD   30 mg at 03/06/24 0918   morphine  (PF) 2 MG/ML injection 2 mg  2 mg Intravenous Q4H PRN Mansy, Jan A, MD   2 mg at 03/06/24 0557   ondansetron  (ZOFRAN ) tablet 4 mg  4 mg Oral Q6H PRN Laurita Cort DASEN, MD       Or   ondansetron  (ZOFRAN ) injection 4 mg  4 mg Intravenous Q6H PRN Laurita Cort DASEN, MD         Review of Systems Full ROS  was asked and was negative except for the information on the HPI  Physical Exam Blood pressure 110/77, pulse 86, temperature 98.4 F (36.9 C), temperature source Oral, resp. rate 18, height 5' 8 (1.727 m), weight 72.1 kg, last menstrual period 01/22/2024, SpO2 94%. CONSTITUTIONAL: NAD chaperone present. EYES: Pupils are equal, round, Sclera are non-icteric. EARS, NOSE, MOUTH AND THROAT: The oropharynx is clear. The oral mucosa is pink and moist. Hearing is intact to voice. LYMPH NODES:  Lymph nodes in the neck are normal. RESPIRATORY:  Lungs are clear. There is normal respiratory effort, with equal breath sounds bilaterally, and without pathologic use of  accessory muscles. CARDIOVASCULAR: Heart is regular without murmurs, gallops, or rubs. GI: The abdomen is  soft, nontender, and nondistended. There are no palpable masses. There is no hepatosplenomegaly. There are normal bowel sounds in all quadrants. GU: Rectal deferred.   MUSCULOSKELETAL: Normal muscle strength and tone. No cyanosis or edema.   SKIN: there is erythema and fluctuance on LEft inner thigh,c/w abscess, no necrotizing infection NEUROLOGIC: Motor and sensation is grossly normal. Cranial nerves are grossly intact. PSYCH:  Oriented to person, place and time. Affect is normal.  Data Reviewed I have personally reviewed the patient's imaging, laboratory findings and medical records.    Assessment/Plan 28 year old female uncontrolled diabetes presents with cellulitis and abscess in the left thigh not responsive to antibiotic therapy.  Discussed with the patient in detail about her disease process.  She will definitely need a formal I&D.  She is too tender at the bedside to attempt a local I&D.  I think if she is better served with a formal I&D in the OR with anesthesia.  Procedure discussed with her in detail.  There is the benefits and the possible risks including but not limited to: Bleeding, infection recurrence, deformity and chronic pain she understands and wished to proceed I personally spent a total of 75 minutes in the care of the patient today including performing a medically appropriate exam/evaluation, counseling and educating, placing orders, referring and communicating with other health care professionals, documenting clinical information in the EHR, independently interpreting and reviewing images studies and coordinating care.    Laneta Luna, MD FACS General Surgeon 03/06/2024, 1:33 PM

## 2024-03-06 NOTE — Plan of Care (Addendum)
 Pt is alert and oriented x 4. Up adlib. Last bm 10/14. Edema to right groin at abscess non pitting. Abt. Given during this shift. Toradol  given x 1. MD contacted due to pain severe but only medication for moderate pain. Morphine  added for severe pain and given. Vitals stable. Afebrile. Warm compress applied to groin for comfort and abdpad Problem: Education: Goal: Ability to describe self-care measures that may prevent or decrease complications (Diabetes Survival Skills Education) will improve Outcome: Progressing Goal: Individualized Educational Video(s) Outcome: Progressing   Problem: Coping: Goal: Ability to adjust to condition or change in health will improve Outcome: Progressing   Problem: Fluid Volume: Goal: Ability to maintain a balanced intake and output will improve Outcome: Progressing   Problem: Health Behavior/Discharge Planning: Goal: Ability to identify and utilize available resources and services will improve Outcome: Progressing Goal: Ability to manage health-related needs will improve Outcome: Progressing   Problem: Metabolic: Goal: Ability to maintain appropriate glucose levels will improve Outcome: Progressing   Problem: Nutritional: Goal: Maintenance of adequate nutrition will improve Outcome: Progressing Goal: Progress toward achieving an optimal weight will improve Outcome: Progressing   Problem: Skin Integrity: Goal: Risk for impaired skin integrity will decrease Outcome: Progressing   Problem: Tissue Perfusion: Goal: Adequacy of tissue perfusion will improve Outcome: Progressing   Problem: Education: Goal: Knowledge of General Education information will improve Description: Including pain rating scale, medication(s)/side effects and non-pharmacologic comfort measures Outcome: Progressing   Problem: Health Behavior/Discharge Planning: Goal: Ability to manage health-related needs will improve Outcome: Progressing   Problem: Clinical  Measurements: Goal: Ability to maintain clinical measurements within normal limits will improve Outcome: Progressing Goal: Will remain free from infection Outcome: Progressing Goal: Diagnostic test results will improve Outcome: Progressing Goal: Respiratory complications will improve Outcome: Progressing Goal: Cardiovascular complication will be avoided Outcome: Progressing   Problem: Activity: Goal: Risk for activity intolerance will decrease Outcome: Progressing   Problem: Nutrition: Goal: Adequate nutrition will be maintained Outcome: Progressing   Problem: Coping: Goal: Level of anxiety will decrease Outcome: Progressing   Problem: Elimination: Goal: Will not experience complications related to bowel motility Outcome: Progressing Goal: Will not experience complications related to urinary retention Outcome: Progressing   Problem: Pain Managment: Goal: General experience of comfort will improve and/or be controlled Outcome: Progressing   Problem: Safety: Goal: Ability to remain free from injury will improve Outcome: Progressing   Problem: Skin Integrity: Goal: Risk for impaired skin integrity will decrease Outcome: Progressing

## 2024-03-06 NOTE — Telephone Encounter (Signed)
 Pharmacy Patient Advocate Encounter   Received notification from Inpatient Request that prior authorization for FreeStyle Libre 3 Plus Sensor is required/requested.   Insurance verification completed.   The patient is insured through [employer].   Per test claim: PA required and submitted KEY/EOC/Request #: [id number] from 03/06/2024 to 09/02/2024

## 2024-03-06 NOTE — Progress Notes (Signed)
  Progress Note   Patient: Rebecca Callahan FMW:[id number] DOB: December 02, 1995 DOA: 03/05/2024     1 DOS: the patient was seen and examined on 03/06/2024   Brief hospital course: Rebecca Callahan is a 28 y.o. female with medical history significant of IDDM came in with worsening of left groin swelling and pain.  CT scan showed a groin cellulitis with less than 1 cm of abscess.  Patient was started on cefepime and doxycycline .   Principal Problem:   Groin abscess Active Problems:   Abscess of groin, right   Iron deficiency anemia   Uncontrolled type 1 diabetes mellitus with hyperglycemia, with long-term current use of insulin  (HCC)   Assessment and Plan: Left groin abscess. Patient has a fairly large induration in the left groin area, CT scan only showed a small abscess, it may be larger than CT measurements. I have contacted general surgery for possible I&D. Culture 10/11 was positive for MSSA, which is pansensitive. Currently covered with cefepime and doxycycline  which be more than enough.  Iron deficient anemia. Globin level dropped down to 7.1, patient has significant iron deficiency with very lower ferritin.  Will start iron supplement. B12 level borderline, check homocystine level. Will give her dose of injected B12 today. Patient currently asymptomatic, no need for transfusion.  Will recheck level tomorrow.  Uncontrolled type 1 diabetes with hyperglycemia Glucose running high, increase dose of Lantus, add scheduled NovoLog  in addition to sliding scale insulin . Patient had a history of DKA, currently not in DKA.       Subjective: \ He still has some pain in the left groin area, no fever or chills.  No nausea vomiting  Physical Exam: Vitals:   03/05/24 1526 03/05/24 2007 03/06/24 0300 03/06/24 0734  BP: 112/65 107/63 115/78 110/77  Pulse:  82 73 86  Resp: 16 16 16 18   Temp: 98.1 F (36.7 C) 99.4 F (37.4 C) 98.3 F (36.8 C) 98.4 F (36.9 C)  TempSrc:  Oral Oral Oral Oral  SpO2: 100% 97% 100% 94%  Weight:      Height:       General exam: Appears calm and comfortable  Respiratory system: Clear to auscultation. Respiratory effort normal. Cardiovascular system: S1 & S2 heard, RRR. No JVD, murmurs, rubs, gallops or clicks. No pedal edema. Gastrointestinal system: Abdomen is nondistended, soft and nontender. No organomegaly or masses felt. Normal bowel sounds heard. Central nervous system: Alert and oriented. No focal neurological deficits. Extremities: Symmetric 5 x 5 power. Skin: No rashes, lesions or ulcers Psychiatry: Judgement and insight appear normal. Mood & affect appropriate.  Left groin has induration 6 x 6 cm, tender to touch.   Data Reviewed:  CT scan and lab results reviewed.  Family Communication: None.  Disposition: Status is: Inpatient Remains inpatient appropriate because: Severity of disease, IV treatment     Time spent: 50 minutes  Author: Murvin Mana, MD 03/06/2024 1:31 PM  For on call review [email or web address].

## 2024-03-06 NOTE — Inpatient Diabetes Management (Addendum)
 Inpatient Diabetes Program Recommendations  AACE/ADA: New Consensus Statement on Inpatient Glycemic Control (2015)  Target Ranges:  Prepandial:   less than 140 mg/dL      Peak postprandial:   less than 180 mg/dL (1-2 hours)      Critically ill patients:  140 - 180 mg/dL    Latest Reference Range & Units 03/05/24 12:21  Hemoglobin A1C 4.8 - 5.6 % 9.8 (H)  234 mg/dl  (H): Data is abnormally high  Latest Reference Range & Units 03/05/24 11:36 03/05/24 17:48 03/05/24 21:41 03/06/24 07:36  Glucose-Capillary 70 - 99 mg/dL 78  10 units Lantus @1238  246 (H)  5 units Novolog   219 (H)  2 units Novolog   250 (H)  5 units Novolog   10 units Lantus  (H): Data is abnormally high   Admit with: Left groin cellulitis and abscess   History: Type 1 diabetes  Home DM Meds: Lantus 12 units at HS        Humalog  TID        Humalog  75/25 insulin  14 units BID  Current Orders: Lantus 10 units daily      Novolog  Moderate Correction Scale/ SSI (0-15 units) TID AC + HS   MD- Note CBG 250 this AM  Please consider Increasing the Lantus to 15 units daily  Since 10 unit Lantus dose already given this AM, please give an extra 5 units Lantus X1 dose this AM    PCP: Dr. Norleen Ramsay with [facility] Last seen 11/14/2022 Looks like pt has been using Urgent Care the last few months for care A1c in April 2024 was 12.8% Instructions given at this appt for Insulin : Continue Lantus 14-15 units daily Continue Humalog  SSI (1:15 grams Carbs)    Addendum 11:45am--Met w/ pt at bedside (pt had someone on her cell phone on speaker while I chatted with her).  Confirmed home insulin  regimen (pt pulled up her list of meds from her PCP office): Lantus 15 units at HS + Humalog  1 unit for every 15 grams of carbohydrates.  Has Insulin  at home and has traditional CBG meter at home.  Checks CBGs TID AC + HS.  Admits to seeing CBGs >200 on meter at home.  Admits to not having seen her ENDO in a long time.   Looks like last visit was April 2024 ([facility]).  Was told to take Lantus 15 units at HS + Humalog  1:15 grams Carbs + Humalog  2 units for every 50 mg/dl >CBG of 799 mg/dl.  Reviewed goal CBGs and goal A1c target for home.  Strongly encouraged pt to make follow up appt with ENDO after d/c.      --Will follow patient during hospitalization--  Adina Rudolpho Arrow RN, MSN, CDCES Diabetes Coordinator Inpatient Glycemic Control Team Team Pager: [phone] (8a-5p)

## 2024-03-07 ENCOUNTER — Encounter: Payer: Self-pay | Admitting: Internal Medicine

## 2024-03-07 ENCOUNTER — Inpatient Hospital Stay: Admitting: Certified Registered"

## 2024-03-07 ENCOUNTER — Encounter
Admission: EM | Disposition: A | Discharge status: Home / Self Care | Payer: Self-pay | Source: Home / Self Care | Attending: Internal Medicine

## 2024-03-07 DIAGNOSIS — E1065 Type 1 diabetes mellitus with hyperglycemia: Secondary | ICD-10-CM | POA: Diagnosis not present

## 2024-03-07 DIAGNOSIS — L02214 Cutaneous abscess of groin: Secondary | ICD-10-CM | POA: Diagnosis not present

## 2024-03-07 DIAGNOSIS — L02416 Cutaneous abscess of left lower limb: Secondary | ICD-10-CM | POA: Diagnosis not present

## 2024-03-07 DIAGNOSIS — D508 Other iron deficiency anemias: Secondary | ICD-10-CM | POA: Diagnosis not present

## 2024-03-07 HISTORY — PX: INCISION AND DRAINAGE OF WOUND: SHX1803

## 2024-03-07 LAB — BASIC METABOLIC PANEL WITH GFR
Anion gap: 6 (ref 5–15)
BUN: 12 mg/dL (ref 6–20)
CO2: 22 mmol/L (ref 22–32)
Calcium: 8.5 mg/dL — ABNORMAL LOW (ref 8.9–10.3)
Chloride: 107 mmol/L (ref 98–111)
Creatinine, Ser: 0.59 mg/dL (ref 0.44–1.00)
GFR, Estimated: >60 mL/min
Glucose, Bld: 300 mg/dL — ABNORMAL HIGH (ref 70–99)
Potassium: 4 mmol/L (ref 3.5–5.1)
Sodium: 135 mmol/L (ref 135–145)

## 2024-03-07 LAB — CBC
HCT: 24.1 % — ABNORMAL LOW (ref 36.0–46.0)
Hemoglobin: 7.1 g/dL — ABNORMAL LOW (ref 12.0–15.0)
MCH: 21.5 pg — ABNORMAL LOW (ref 26.0–34.0)
MCHC: 29.5 g/dL — ABNORMAL LOW (ref 30.0–36.0)
MCV: 73 fL — ABNORMAL LOW (ref 80.0–100.0)
Platelets: 166 K/uL (ref 150–400)
RBC: 3.3 MIL/uL — ABNORMAL LOW (ref 3.87–5.11)
RDW: 17.8 % — ABNORMAL HIGH (ref 11.5–15.5)
WBC: 3.8 K/uL — ABNORMAL LOW (ref 4.0–10.5)
nRBC: 0 % (ref 0.0–0.2)

## 2024-03-07 LAB — GLUCOSE, CAPILLARY
Glucose-Capillary: 252 mg/dL — ABNORMAL HIGH (ref 70–99)
Glucose-Capillary: 284 mg/dL — ABNORMAL HIGH (ref 70–99)
Glucose-Capillary: 270 mg/dL — ABNORMAL HIGH (ref 70–99)
Glucose-Capillary: 215 mg/dL — ABNORMAL HIGH (ref 70–99)
Glucose-Capillary: 168 mg/dL — ABNORMAL HIGH (ref 70–99)
Glucose-Capillary: 229 mg/dL — ABNORMAL HIGH (ref 70–99)

## 2024-03-07 LAB — MAGNESIUM: Magnesium: 2 mg/dL (ref 1.7–2.4)

## 2024-03-07 LAB — MRSA NEXT GEN BY PCR, NASAL: MRSA by PCR Next Gen: NOT DETECTED

## 2024-03-07 SURGERY — IRRIGATION AND DEBRIDEMENT WOUND
Anesthesia: General | Site: Thigh | Laterality: Left

## 2024-03-07 MED ORDER — MIDAZOLAM HCL 2 MG/2ML IJ SOLN
INTRAMUSCULAR | Status: AC
  Filled 2024-03-07: qty 2

## 2024-03-07 MED ORDER — DOXYCYCLINE HYCLATE 100 MG PO TABS
100.0000 mg | ORAL_TABLET | Freq: Two times a day (BID) | ORAL | Status: DC
  Administered 2024-03-07 – 2024-03-09 (×5): 100 mg via ORAL
  Filled 2024-03-07 (×6): qty 1

## 2024-03-07 MED ORDER — FENTANYL CITRATE (PF) 100 MCG/2ML IJ SOLN
INTRAMUSCULAR | Status: DC | PRN
  Administered 2024-03-07: 100 ug via INTRAVENOUS

## 2024-03-07 MED ORDER — INSULIN ASPART 100 UNIT/ML IJ SOLN
10.0000 [IU] | Freq: Once | INTRAMUSCULAR | Status: AC
  Administered 2024-03-07: 10 [IU] via SUBCUTANEOUS

## 2024-03-07 MED ORDER — LIDOCAINE HCL (CARDIAC) PF 100 MG/5ML IV SOSY
PREFILLED_SYRINGE | INTRAVENOUS | Status: DC | PRN
  Administered 2024-03-07: 60 mg via INTRAVENOUS

## 2024-03-07 MED ORDER — FENTANYL CITRATE (PF) 100 MCG/2ML IJ SOLN
INTRAMUSCULAR | Status: AC
  Filled 2024-03-07: qty 2

## 2024-03-07 MED ORDER — PROPOFOL 10 MG/ML IV BOLUS
INTRAVENOUS | Status: AC
  Filled 2024-03-07: qty 20

## 2024-03-07 MED ORDER — CEPHALEXIN 500 MG PO CAPS
500.0000 mg | ORAL_CAPSULE | Freq: Four times a day (QID) | ORAL | Status: DC
  Administered 2024-03-07 – 2024-03-09 (×8): 500 mg via ORAL
  Filled 2024-03-07 (×8): qty 1

## 2024-03-07 MED ORDER — BUPIVACAINE-EPINEPHRINE (PF) 0.25% -1:200000 IJ SOLN
INTRAMUSCULAR | Status: AC
  Filled 2024-03-07: qty 30

## 2024-03-07 MED ORDER — SODIUM CHLORIDE 0.9 % IV SOLN: INTRAVENOUS | Status: DC | PRN

## 2024-03-07 MED ORDER — FENTANYL CITRATE (PF) 100 MCG/2ML IJ SOLN
25.0000 ug | INTRAMUSCULAR | Status: DC | PRN
  Administered 2024-03-07 (×3): 25 ug via INTRAVENOUS
  Administered 2024-03-07: 50 ug via INTRAVENOUS
  Administered 2024-03-07: 25 ug via INTRAVENOUS

## 2024-03-07 MED ORDER — DROPERIDOL 2.5 MG/ML IJ SOLN: 0.6250 mg | Freq: Once | INTRAMUSCULAR | Status: DC | PRN

## 2024-03-07 MED ORDER — ONDANSETRON HCL 4 MG/2ML IJ SOLN
INTRAMUSCULAR | Status: DC | PRN
  Administered 2024-03-07: 4 mg via INTRAVENOUS

## 2024-03-07 MED ORDER — INSULIN ASPART 100 UNIT/ML IJ SOLN
0.0000 [IU] | Freq: Three times a day (TID) | INTRAMUSCULAR | Status: DC
  Administered 2024-03-07: 3 [IU] via SUBCUTANEOUS
  Administered 2024-03-07: 2 [IU] via SUBCUTANEOUS
  Administered 2024-03-08: 5 [IU] via SUBCUTANEOUS
  Administered 2024-03-08: 2 [IU] via SUBCUTANEOUS
  Administered 2024-03-08: 3 [IU] via SUBCUTANEOUS
  Administered 2024-03-09: 2 [IU] via SUBCUTANEOUS
  Filled 2024-03-07 (×6): qty 1

## 2024-03-07 MED ORDER — INSULIN ASPART 100 UNIT/ML IJ SOLN
INTRAMUSCULAR | Status: AC
  Filled 2024-03-07: qty 1

## 2024-03-07 MED ORDER — DEXAMETHASONE SOD PHOSPHATE PF 10 MG/ML IJ SOLN
INTRAMUSCULAR | Status: DC | PRN
  Administered 2024-03-07: 4 mg via INTRAVENOUS

## 2024-03-07 MED ORDER — INSULIN ASPART 100 UNIT/ML IJ SOLN
3.0000 [IU] | Freq: Three times a day (TID) | INTRAMUSCULAR | Status: DC
  Administered 2024-03-07 – 2024-03-09 (×6): 3 [IU] via SUBCUTANEOUS
  Filled 2024-03-07 (×6): qty 1

## 2024-03-07 MED ORDER — SODIUM CHLORIDE 0.9 % IV SOLN
2.0000 g | Freq: Three times a day (TID) | INTRAVENOUS | Status: DC
  Filled 2024-03-07: qty 12.5

## 2024-03-07 MED ORDER — 0.9 % SODIUM CHLORIDE (POUR BTL) OPTIME
TOPICAL | Status: DC | PRN
  Administered 2024-03-07: 500 mL

## 2024-03-07 MED ORDER — DEXMEDETOMIDINE HCL IN NACL 80 MCG/20ML IV SOLN
INTRAVENOUS | Status: DC | PRN
  Administered 2024-03-07: 8 ug via INTRAVENOUS
  Administered 2024-03-07: 12 ug via INTRAVENOUS

## 2024-03-07 MED ORDER — PROPOFOL 10 MG/ML IV BOLUS
INTRAVENOUS | Status: DC | PRN
  Administered 2024-03-07: 150 mg via INTRAVENOUS

## 2024-03-07 MED ORDER — MIDAZOLAM HCL (PF) 2 MG/2ML IJ SOLN
INTRAMUSCULAR | Status: DC | PRN
  Administered 2024-03-07: 2 mg via INTRAVENOUS

## 2024-03-07 MED ORDER — OXYCODONE HCL 5 MG PO TABS
5.0000 mg | ORAL_TABLET | ORAL | Status: DC | PRN
  Administered 2024-03-07 – 2024-03-08 (×2): 5 mg via ORAL
  Filled 2024-03-07 (×2): qty 1

## 2024-03-07 MED ORDER — INSULIN GLARGINE 100 UNIT/ML ~~LOC~~ SOLN
15.0000 [IU] | Freq: Every day | SUBCUTANEOUS | Status: DC
  Administered 2024-03-07 – 2024-03-08 (×2): 15 [IU] via SUBCUTANEOUS
  Filled 2024-03-07 (×3): qty 0.15

## 2024-03-07 MED ADMIN — Cefazolin Sodium for IV Soln 2 GM and Dextrose 3% (50 ML): 2 g | INTRAVENOUS | NDC 00264310511

## 2024-03-07 SURGICAL SUPPLY — 24 items
BLADE CLIPPER SURG (BLADE) ×1 IMPLANT
BLADE SURG 15 STRL LF DISP TIS (BLADE) ×1 IMPLANT
BRIEF MESH DISP 2XL (UNDERPADS AND DIAPERS) IMPLANT
DRAPE LAPAROTOMY TRNSV 106X77 (MISCELLANEOUS) ×1 IMPLANT
ELECT CAUTERY BLADE 6.4 (BLADE) ×1 IMPLANT
ELECTRODE REM PT RTRN 9FT ADLT (ELECTROSURGICAL) ×1 IMPLANT
GAUZE 4X4 16PLY ~~LOC~~+RFID DBL (SPONGE) ×1 IMPLANT
GAUZE SPONGE 4X4 12PLY STRL (GAUZE/BANDAGES/DRESSINGS) IMPLANT
GAUZE STRETCH 2X75IN STRL (MISCELLANEOUS) IMPLANT
GLOVE BIO SURGEON STRL SZ7 (GLOVE) ×1 IMPLANT
GOWN STRL REUS W/ TWL LRG LVL3 (GOWN DISPOSABLE) ×2 IMPLANT
MANIFOLD NEPTUNE II (INSTRUMENTS) ×1 IMPLANT
NDL HYPO 22X1.5 SAFETY MO (MISCELLANEOUS) ×1 IMPLANT
NEEDLE HYPO 22X1.5 SAFETY MO (MISCELLANEOUS) ×1 IMPLANT
NS IRRIG 500ML POUR BTL (IV SOLUTION) IMPLANT
PACK BASIN MINOR ARMC (MISCELLANEOUS) ×1 IMPLANT
SOLN 0.9% NACL 1000 ML (IV SOLUTION) IMPLANT
SOLN 0.9% NACL POUR BTL 1000ML (IV SOLUTION) ×1 IMPLANT
SOLUTION PREP PVP 2OZ (MISCELLANEOUS) ×1 IMPLANT
SPONGE T-LAP 18X18 ~~LOC~~+RFID (SPONGE) ×1 IMPLANT
SWAB CULTURE AMIES ANAERIB BLU (MISCELLANEOUS) IMPLANT
SYR 20ML LL LF (SYRINGE) ×1 IMPLANT
TRAP FLUID SMOKE EVACUATOR (MISCELLANEOUS) ×1 IMPLANT
WATER STERILE IRR 500ML POUR (IV SOLUTION) ×1 IMPLANT

## 2024-03-07 NOTE — Discharge Instructions (Signed)
 Wound Packing Wound packing usually involves placing a moistened packing material into your wound and then covering it with an outer bandage (dressing). This helps support the healing of deep tissue and tissue under the skin. It also helps prevent bleeding, infection, and further injury. Wounds are packed until deep tissues heal. The time it takes for this to happen is different for everyone. Your health care provider will show you how to pack and dress your wound. Using gloves and a clean technique is important to avoid spreading germs into your wound. Supplies needed: Soap and water. Disposable gloves. Cleansing or wetting solution, such as saline, germ-free (sterile) water, or an antiseptic solution. Clean bowl. Clean packing material, such as gauze, gauze sponges, or rolled gauze. Clean paper towels. Outer dressing. This includes the cover dressing and tape, or a dressing with an adhesive border. Cotton-tipped swabs. Small plastic bag for trash. How to pack your wound Follow your health care provider's instructions on how often you need to change dressings and pack your wound. You will likely be asked to change your dressings 1 to 2 times a day. Preparing to change the wound packing If needed, take pain medicine 30 minutes before you pack your wound as told by your health care provider. Preparing the new packing material  Clean and disinfect your work surface or countertop. Set a plastic bag on or near your work surface. Wash your hands with soap and water for at least 20 seconds before you change the dressing. If soap and water are not available, use hand sanitizer. Put a clean paper towel on the counter. Put a clean bowl on the towel. Only touch the outside of the bowl when handling it. Pour the cleansing or wetting solution that your health care provider tells you to use into the bowl. Select and cut your packing material to fit the size of your wound. Avoid using multiple pieces of  packing material. Drop it into the bowl. Cut tape strips that you will use to seal the outer dressing, if needed. Put gauze pads for cleansing and cotton-tipped swabs on the clean paper towel. Removing the old packing material and dressing Put on a set of gloves. Gently remove the old dressing and packing material. Make sure to check how the drainage looks or if there is any odor. Clean or rinse (irrigate) the wound. Remove your gloves. Put the removed items, including gloves, into the plastic bag to throw away later. Wash your hands again with soap and water for at least 20 seconds. If soap and water are not available, use hand sanitizer. Applying the new packing material and dressing  Put on a new set of gloves. Squeeze the packing material in the bowl to release the extra liquid. The packing material should be moist, but not dripping wet. Gently place the packing material into the wound. Use a cotton-tipped swab to guide it into place, filling all of the space. Do not overpack the wound bed. Dry your gloved fingertips on the paper towel. Open up your outer dressing supplies and put them on a dry part of the paper towel. Keep them from getting wet. Place the outer dressing over the packed wound. Tape the edges of the outer dressing in place. Remove your gloves. Wash your hands again with soap and water for at least 20 seconds. If soap and water are not available, use hand sanitizer. Put the removed items, including gloves, into the plastic bag to throw away. Clean and disinfect your work  surface or countertop. General tips Follow your health care provider's instructions on how much to pack the wound. At first, you may need to pack it more fully to help stop bleeding. As the wound begins to heal inside, you will use less packing material and pack the wound loosely to allow the tissue to heal slowly from the inside out. Do not take baths, swim, or use a hot tub until your health care  provider approves. Ask your health care provider if you may take showers. You may only be allowed to take sponge baths. Keep the dressing clean and dry. Follow any other instructions given by your health care provider on how to aid healing. This may include applying warm or cold compresses, raising (elevating) the affected area, or wearing a compression dressing. Check your wound site every day for signs of infection. Check for: More redness, swelling, or pain. More fluid or blood. Warmth or hardness (induration). Pus or a bad smell. Protect your wound from the sun when you are outside for the first 6 months, or for as long as told by your health care provider. Cover up the scar area or apply sunscreen that has an SPF of at least 30. Keep all follow-up visits. This is important. Contact a health care provider if: Your pain is not controlled with pain medicine. You have more drainage, redness, swelling, or pain at your wound site. You have new rash, warmth, or induration around the wound. You have a fever or chills. Your wound becomes larger or deeper. Get help right away if: The tissue inside your wound changes color from pink to white, yellow, or black. You notice a bad smell or pus coming from the wound site. You are having trouble packing your wound. Your wound is bleeding, and the bleeding does not stop with gentle pressure. These symptoms may represent a serious problem that is an emergency. Do not wait to see if the symptoms will go away. Get medical help right away. Call your local emergency services (911 in the [place] Do not drive yourself to the hospital. Summary Wound packing usually involves placing a moistened packing material into your wound and then covering it with an outer bandage (dressing). Follow your health care provider's instructions on how often you need to change dressings and pack your wound. You will likely be asked to change dressings 1 to 2 times a day. When  packing your wound, it is important to use gloves to avoid spreading germs into the wound. Check your wound site every day for signs of infection. This information is not intended to replace advice given to you by your health care provider. Make sure you discuss any questions you have with your health care provider. Document Revised: 09/14/2020 Document Reviewed: 09/14/2020 Elsevier Patient Education  2024 [employer]   Incision and Drainage, Care After After incision and drainage, it is common to have: Pain or discomfort around the incision site. Blood, fluid, or pus (drainage) from the incision. Redness and firm skin around the incision site. Follow these instructions at home: Medicines Take over-the-counter and prescription medicines only as told by your health care provider. If you were prescribed antibiotics, take them as told by your provider. Do not stop using the antibiotic even if you start to feel better. Do not apply creams, ointments, or liquids unless you have been told to by your provider. Wound care Follow instructions from your provider about how to take care of your wound. Make sure you: Wash your  hands with soap and water for at least 20 seconds before and after you change your bandage (dressing). If soap and water are not available, use hand sanitizer. Change your dressing and any packing as told by your provider. If the dressing is dry or stuck when you try to remove it, moisten or wet it with saline or water. This will help you remove it without harming your skin or tissues. If your wound is packed, leave it in place until your provider tells you to remove it. To remove it, moisten or wet the packing with saline or water. Leave stitches (sutures), skin glue, or tape strips in place. These skin closures may need to stay in place for 2 weeks or longer. If tape strip edges start to loosen and curl up, you may trim the loose edges. Do not remove tape strips completely  unless your provider tells you to do that. Check your wound every day for signs of infection. Check for: More redness, swelling, or pain. More fluid or blood. Warmth. Pus or a bad smell. If you were sent home with a drain tube in place, follow instructions from your provider about: How to empty it. How to care for it at home. Be careful when you get rid of used dressings, wound packing, or drainage. Activity Rest the affected area. Return to your normal activities as told by your provider. Ask your provider what activities are safe for you. General instructions Do not use any products that contain nicotine or tobacco. These products include cigarettes, chewing tobacco, and vaping devices, such as e-cigarettes. These can delay incision healing after surgery. If you need help quitting, ask your provider. Do not take baths, swim, or use a hot tub until your provider approves. Ask your provider if you may take showers. You may only be allowed to take sponge baths. The incision will keep draining. It is normal to have some clear or slightly bloody drainage. The amount of drainage should go down each day. Keep all follow-up visits. Your provider will need to make sure that your incision is healing well and that there are no problems. Your health care provider may give you more instructions. Make sure you know what you can and cannot do Contact a health care provider if: Your cyst or abscess comes back. You have any signs of infection. You notice red streaks that spread away from the incision site. You have a fever or chills. Get help right away if: You have severe pain or bleeding. You become short of breath. You have chest pain. You have signs of a severe infection. You may notice changes in your incision area, such as: Swelling that makes the skin feel hard. Numbness or tingling. Sudden increase in redness. Your skin color may change from red to purple, and then to dark spots. Blisters,  ulcers, or splitting of the skin. These symptoms may be an emergency. Get help right away. Call 911. Do not wait to see if the symptoms will go away. Do not drive yourself to the hospital. This information is not intended to replace advice given to you by your health care provider. Make sure you discuss any questions you have with your health care provider. Document Revised: 12/26/2021 Document Reviewed: 12/26/2021 Elsevier Patient Education  2024 [employer].

## 2024-03-07 NOTE — Plan of Care (Signed)

## 2024-03-07 NOTE — Interval H&P Note (Signed)
 History and Physical Interval Note:  03/07/2024 9:01 AM  Rebecca Callahan  has presented today for surgery, with the diagnosis of left thigh wound.  The various methods of treatment have been discussed with the patient and family. After consideration of risks, benefits and other options for treatment, the patient has consented to  Procedure(s) with comments: IRRIGATION AND DEBRIDEMENT WOUND (Left) - LEFT THIGH as a surgical intervention.  The patient's history has been reviewed, patient examined, no change in status, stable for surgery.  I have reviewed the patient's chart and labs.  Questions were answered to the patient's satisfaction.     Humna Moorehouse F Stefanie Hodgens

## 2024-03-07 NOTE — Transfer of Care (Signed)
 Immediate Anesthesia Transfer of Care Note  Patient: Rebecca Callahan  Procedure(s) Performed: IRRIGATION AND DEBRIDEMENT WOUND (Left: Thigh)  Patient Location: PACU  Anesthesia Type:General  Level of Consciousness: drowsy  Airway & Oxygen Therapy: Patient Spontanous Breathing and Patient connected to face mask oxygen  Post-op Assessment: Report given to RN  Post vital signs: stable  Last Vitals:  Vitals Value Taken Time  BP 110/68 03/07/24 09:45  Temp    Pulse 65 03/07/24 09:46  Resp 16 03/07/24 09:46  SpO2 100 % 03/07/24 09:46  Vitals shown include unfiled device data.  Last Pain:  Vitals:   03/07/24 0819  TempSrc: Temporal  PainSc: 9       Patients Stated Pain Goal: 0 (03/06/24 2046)  Complications: No notable events documented.

## 2024-03-07 NOTE — Progress Notes (Signed)
  Progress Note   Patient: Rebecca Callahan FMW:[id number] DOB: [birth year] DOA: 03/05/2024     2 DOS: the patient was seen and examined on 03/07/2024   Brief hospital course: Rebecca Callahan is a 28 y.o. female with medical history significant of IDDM came in with worsening of left groin swelling and pain.  CT scan showed a groin cellulitis with less than 1 cm of abscess.  Patient was started on cefepime and doxycycline . I&D performed 10/17.   Principal Problem:   Groin abscess Active Problems:   Abscess of groin, right   Iron deficiency anemia   Uncontrolled type 1 diabetes mellitus with hyperglycemia, with long-term current use of insulin  (HCC)   Abscess of left thigh   Assessment and Plan:  Left groin abscess. Patient has a fairly large induration in the left groin area, CT scan only showed a small abscess, it may be larger than CT measurements. I have contacted general surgery for possible I&D. Culture 10/11 was positive for MSSA, which is pansensitive. I&D performed on 10/17, culture is sent.  Patient initially treated with cefepime and doxycycline .  Patient had small vein, could not tolerate IV antibiotics.  Changed to Keflex  and doxycycline .   Iron deficient anemia. Globin level dropped down to 7.1, patient has significant iron deficiency with very lower ferritin.  Started on iron supplement B12 level borderline, check homocystine level still pending, received a 1 dose of injected B12. Patient currently is not in her menstrual period.,  No active bleeding. Recheck a CBC tomorrow.   Uncontrolled type 1 diabetes with hyperglycemia and hypoglycemia Insulin  dose adjusted again   Pseudohyponatremia. Metabolic acidosis. Condition all improved.     Subjective:  Patient is status post I&D, feels better today.  Physical Exam: Vitals:   03/07/24 1045 03/07/24 1050 03/07/24 1100 03/07/24 1133  BP: 116/74  115/69 106/66  Pulse: 80 76 62 71  Resp: 10 10 (!)  8 18  Temp:    97.6 F (36.4 C)  TempSrc:    Oral  SpO2: 94% 100% 99% 100%  Weight:      Height:       General exam: Appears calm and comfortable  Respiratory system: Clear to auscultation. Respiratory effort normal. Cardiovascular system: S1 & S2 heard, RRR. No JVD, murmurs, rubs, gallops or clicks. No pedal edema. Gastrointestinal system: Abdomen is nondistended, soft and nontender. No organomegaly or masses felt. Normal bowel sounds heard. Central nervous system: Alert and oriented. No focal neurological deficits. Extremities: Symmetric 5 x 5 power. Skin: No rashes, lesions or ulcers Psychiatry: Judgement and insight appear normal. Mood & affect appropriate.    Data Reviewed:  Lab results reviewed.  Family Communication: None  Disposition: Status is: Inpatient Remains inpatient appropriate because: Severity of disease.     Time spent: 35 minutes  Author: Murvin Mana, MD 03/07/2024 1:50 PM  For on call review [email or web address].

## 2024-03-07 NOTE — Op Note (Signed)
  03/07/2024  9:39 AM  PATIENT:  Rebecca Callahan  28 y.o. female  PRE-OPERATIVE DIAGNOSIS:  Left thigh abscess  POST-OPERATIVE DIAGNOSIS:  two left thigh abscesses separate  PROCEDURE:   1. Incision and drainage of complex Left thigh abscess proximal inner thigh 2. Incision and drainage of complex Left thigh abscess  inner thigh 2. Excisional debridement of skin subcutaneous tissue and muscle measuring 5 square centimeters, Depth 1.5 cms Down to fascia    SURGEON:  Surgeons and Role:    * Ghazal Pevey, Rebecca FALCON, MD - Primary  ANESTHESIA: General LMA  FINDINGS: Two separate left  inner thigh abscesses Slough and Necrotic tissue removed   EBL: 5cc   DICTATION:  Patient was explained about procedure in detail. Risks, benefits, possible complications and a consent was obtained. The patient taken to the operating room and placed in the supine position, left thigh froged. Patient was prepped and draped in the usual sterile fashion with antiseptic solution. Upon further inspection in the OR there were two separate collection on the left inner thigh. Attention turned to the most distal one that was the largest,. Elliptical incision was created and pus was drained and cultured. There was complex luculations that we were able to lyse with a combination of finger fracture and suction device. All the loculations were broken down. Using a sharp curette we debrided the sub q tissue down to the muscle to include fascia. Area measured 2x2 cms. Hemostasis was obtained with electrocautery.  Attention turned to the most proximal one that was the smallest,. Elliptical incision was created and pus was drained and cultured. There was complex luculations that we were able to lyse with a combination of finger fracture and suction device. All the loculations were broken down. Using a sharp curette we debrided the sub q tissue down to the muscle to include fascia. Area measured 1x1 cm. Hemostasis was obtained  with electrocautery.  Irrigation with normal saline was done to both wounds  and the wounds were packed with half-inch packing. . Needle and laparotomy counts were correct and there were no immediate complications  Tool used for debridement: curette and cautery Frequency of Dressing changes: daily Measurement of total devitalized tissue: Before Debridement: N/A   After: 5 cms2    Rebecca FALCON Luna, MD

## 2024-03-07 NOTE — Inpatient Diabetes Management (Addendum)
 Inpatient Diabetes Program Recommendations  AACE/ADA: New Consensus Statement on Inpatient Glycemic Control (2015)  Target Ranges:  Prepandial:   less than 140 mg/dL      Peak postprandial:   less than 180 mg/dL (1-2 hours)      Critically ill patients:  140 - 180 mg/dL    Latest Reference Range & Units 03/06/24 07:36 03/06/24 12:02 03/06/24 14:24 03/06/24 14:45 03/06/24 17:06 03/06/24 19:51 03/06/24 20:21  Glucose-Capillary 70 - 99 mg/dL 749 (H)  5 units Novolog   10 units Lantus 206 (H) 61 (L) 70 90 62 (L) 83  (H): Data is abnormally high (L): Data is abnormally low   History: Type 1 diabetes   Home DM Meds: Lantus 12 units at HS                              Humalog  TID                              Humalog  75/25 insulin  14 units BID   Current Orders: Lantus 18 units daily                            Novolog  Moderate Correction Scale/ SSI (0-15 units) TID AC + HS      Novolog  4 units TID with meals    MD- Note Hypoglycemia yest at 2pm and again at 8pm  Per pt report, only taking Lantus 15 units at HS  Please consider:  1. Reduce Lantus to 15 units daily (got 10 units yest AM)  2. Reduce the Novolog  SSI to the 0-9 unit Sensitive scale  3. Reduce the Novolog  Meal Coverage to 3 units TID with meals    Addendum 2:10pm--Met w/ pt again today.  Pt wants to start Freestyle Libre 3 glucose sensor at home.  Reviewed Freestyle Libre 3 Plus CGM system (how to use, how to place new sensor, sensor life, troubleshooting, warm-up time, how to use app on phone, rotation of insertion sites, Vitamin C warning, etc).  Assisted pt to download the Freestyle app to her Phone.  Assisted pt to place [place] sensor on R Upper arm.  Pt knows they will able to check app for glucose readings 1hr after sensor placed.  Pt instructed to replace sensor in 15 days and use opposite arm.  Encouraged pt to open app and look at sensor readings pre and post meals.  Reviewed healthy glucose goals for home.  Pt  asked to seek refills for the sensors from their PCP.  Pt educated to check fingerstick CBG with traditional CBG meter when glucose reading does not match how they feels.  RN made aware that CGM applied.      --Will follow patient during hospitalization--  Adina Rudolpho Arrow RN, MSN, CDCES Diabetes Coordinator Inpatient Glycemic Control Team Team Pager: [phone] (8a-5p)

## 2024-03-07 NOTE — Anesthesia Procedure Notes (Signed)
 Procedure Name: LMA Insertion Date/Time: 03/07/2024 9:12 AM  Performed by: Rosine Shona Jansky, CRNAPre-anesthesia Checklist: Patient identified, Emergency Drugs available, Suction available and Patient being monitored Patient Re-evaluated:Patient Re-evaluated prior to induction Oxygen Delivery Method: Circle system utilized Preoxygenation: Pre-oxygenation with 100% oxygen Induction Type: IV induction Ventilation: Mask ventilation without difficulty LMA: LMA inserted LMA Size: 4.0 Number of attempts: 1 Airway Equipment and Method: Oral airway Placement Confirmation: positive ETCO2 and breath sounds checked- equal and bilateral Tube secured with: Tape Dental Injury: Teeth and Oropharynx as per pre-operative assessment

## 2024-03-07 NOTE — Anesthesia Preprocedure Evaluation (Signed)
 Anesthesia Evaluation  Patient identified by MRN, date of birth, ID band Patient awake    Reviewed: Allergy & Precautions, H&P , NPO status , Patient's Chart, lab work & pertinent test results, reviewed documented beta blocker date and time   History of Anesthesia Complications Negative for: history of anesthetic complications  Airway Mallampati: II  TM Distance: >3 FB Neck ROM: full    Dental  (+) Dental Advidsory Given, Missing, Teeth Intact   Pulmonary neg pulmonary ROS   Pulmonary exam normal breath sounds clear to auscultation       Cardiovascular Exercise Tolerance: Good negative cardio ROS Normal cardiovascular exam Rhythm:regular Rate:Normal     Neuro/Psych negative neurological ROS  negative psych ROS   GI/Hepatic negative GI ROS, Neg liver ROS,,,  Endo/Other  diabetes, Poorly Controlled, Type 1    Renal/GU negative Renal ROS  negative genitourinary   Musculoskeletal   Abdominal   Peds  Hematology negative hematology ROS (+)   Anesthesia Other Findings Past Medical History: No date: Diabetes mellitus without complication (HCC)   Reproductive/Obstetrics negative OB ROS                              Anesthesia Physical Anesthesia Plan  ASA: 2  Anesthesia Plan: General   Post-op Pain Management:    Induction: Intravenous  PONV Risk Score and Plan: 3 and Ondansetron , Dexamethasone, Midazolam and Treatment may vary due to age or medical condition  Airway Management Planned: LMA  Additional Equipment:   Intra-op Plan:   Post-operative Plan: Extubation in OR  Informed Consent: I have reviewed the patients History and Physical, chart, labs and discussed the procedure including the risks, benefits and alternatives for the proposed anesthesia with the patient or authorized representative who has indicated his/her understanding and acceptance.     Dental Advisory  Given  Plan Discussed with: Anesthesiologist, CRNA and Surgeon  Anesthesia Plan Comments:         Anesthesia Quick Evaluation

## 2024-03-08 DIAGNOSIS — L02214 Cutaneous abscess of groin: Secondary | ICD-10-CM | POA: Diagnosis not present

## 2024-03-08 DIAGNOSIS — D508 Other iron deficiency anemias: Secondary | ICD-10-CM | POA: Diagnosis not present

## 2024-03-08 DIAGNOSIS — E1065 Type 1 diabetes mellitus with hyperglycemia: Secondary | ICD-10-CM | POA: Diagnosis not present

## 2024-03-08 LAB — CBC
HCT: 24.3 % — ABNORMAL LOW (ref 36.0–46.0)
Hemoglobin: 7 g/dL — ABNORMAL LOW (ref 12.0–15.0)
MCH: 21.1 pg — ABNORMAL LOW (ref 26.0–34.0)
MCHC: 28.8 g/dL — ABNORMAL LOW (ref 30.0–36.0)
MCV: 73.2 fL — ABNORMAL LOW (ref 80.0–100.0)
Platelets: 173 K/uL (ref 150–400)
RBC: 3.32 MIL/uL — ABNORMAL LOW (ref 3.87–5.11)
RDW: 18 % — ABNORMAL HIGH (ref 11.5–15.5)
WBC: 4.3 K/uL (ref 4.0–10.5)
nRBC: 0 % (ref 0.0–0.2)

## 2024-03-08 LAB — PREPARE RBC (CROSSMATCH)

## 2024-03-08 LAB — GLUCOSE, CAPILLARY
Glucose-Capillary: 282 mg/dL — ABNORMAL HIGH (ref 70–99)
Glucose-Capillary: 205 mg/dL — ABNORMAL HIGH (ref 70–99)
Glucose-Capillary: 153 mg/dL — ABNORMAL HIGH (ref 70–99)
Glucose-Capillary: 44 mg/dL — CL (ref 70–99)
Glucose-Capillary: 109 mg/dL — ABNORMAL HIGH (ref 70–99)

## 2024-03-08 LAB — ABO/RH: ABO/RH(D): B POS

## 2024-03-08 LAB — HEMOGLOBIN: Hemoglobin: 6.8 g/dL — ABNORMAL LOW (ref 12.0–15.0)

## 2024-03-08 MED ORDER — CYANOCOBALAMIN 1000 MCG/ML IJ SOLN
1000.0000 ug | Freq: Once | INTRAMUSCULAR | Status: AC
  Administered 2024-03-08: 1000 ug via INTRAMUSCULAR
  Filled 2024-03-08: qty 1

## 2024-03-08 MED ORDER — SENNOSIDES-DOCUSATE SODIUM 8.6-50 MG PO TABS
2.0000 | ORAL_TABLET | Freq: Two times a day (BID) | ORAL | Status: DC
  Administered 2024-03-08 – 2024-03-09 (×3): 2 via ORAL
  Filled 2024-03-08 (×3): qty 2

## 2024-03-08 MED ORDER — IRON SUCROSE 300 MG IVPB - SIMPLE MED
300.0000 mg | Freq: Once | Status: AC
  Administered 2024-03-08: 300 mg via INTRAVENOUS
  Filled 2024-03-08: qty 300

## 2024-03-08 MED ORDER — SODIUM CHLORIDE 0.9% IV SOLUTION: Freq: Once | INTRAVENOUS | Status: DC

## 2024-03-08 MED ORDER — OXYCODONE HCL 5 MG PO TABS
5.0000 mg | ORAL_TABLET | ORAL | Status: DC | PRN
Start: 2024-03-08 — End: 2024-03-09
  Administered 2024-03-08 – 2024-03-09 (×4): 5 mg via ORAL
  Filled 2024-03-08 (×4): qty 1

## 2024-03-08 NOTE — Progress Notes (Addendum)
  Progress Note   Patient: Rebecca Callahan FMW:[id number] DOB: Apr 14, 1996 DOA: 03/05/2024     3 DOS: the patient was seen and examined on 03/08/2024   Brief hospital course: Rebecca Callahan is a 28 y.o. female with medical history significant of IDDM came in with worsening of left groin swelling and pain.  CT scan showed a groin cellulitis with less than 1 cm of abscess.  Patient was started on cefepime and doxycycline . I&D performed 10/17.   Principal Problem:   Groin abscess Active Problems:   Abscess of groin, right   Iron deficiency anemia   Uncontrolled type 1 diabetes mellitus with hyperglycemia, with long-term current use of insulin  (HCC)   Abscess of left thigh   Assessment and Plan:  Left groin abscess. Patient has a fairly large induration in the left groin area, CT scan only showed a small abscess. Culture 10/11 was positive for MSSA, which is pansensitive. I&D performed on 10/17, culture is sent, pending results.  Gram stain showed rare gram-negative rods and gram-positive cocci.  Patient initially treated with cefepime and doxycycline .  Patient had small vein, could not tolerate IV antibiotics.  Changed to Keflex  and doxycycline . Condition appear to be improving   Iron deficient anemia. Globin level dropped down to 7.1, patient has significant iron deficiency with very lower ferritin.  Started on iron supplement B12 level borderline, check homocystine level still pending, received a 1 dose of injected B12. Patient currently is not in her menstrual period.,  No active bleeding. Hemoglobin today is at 7.0.  Will give IV iron and another B12 today injection, homocystine level still pending.  Recheck hemoglobin by 1 PM, transfuse PRBC if hemoglobin continue to drop.  Patient agreeable for transfusion.  Addendum: Repeat Hb 6.8, ordered 1 unit PRBC.    Uncontrolled type 1 diabetes with hyperglycemia and hypoglycemia    Pseudohyponatremia. Metabolic  acidosis. Condition all improved.        Subjective:  Doing well today, has some pain in the right leg.  Physical Exam: Vitals:   03/07/24 1647 03/07/24 1912 03/08/24 0406 03/08/24 0731  BP: 110/65 121/74 119/82 122/70  Pulse: 70 82 62 68  Resp: 16 17 16 18   Temp: 98.6 F (37 C) 98 F (36.7 C) 97.9 F (36.6 C) 98 F (36.7 C)  TempSrc: Oral  Oral Oral  SpO2: 96% 100% 99% 100%  Weight:      Height:       General exam: Appears calm and comfortable  Respiratory system: Clear to auscultation. Respiratory effort normal. Cardiovascular system: S1 & S2 heard, RRR. No JVD, murmurs, rubs, gallops or clicks. No pedal edema. Gastrointestinal system: Abdomen is nondistended, soft and nontender. No organomegaly or masses felt. Normal bowel sounds heard. Central nervous system: Alert and oriented. No focal neurological deficits. Extremities: Symmetric 5 x 5 power. Skin: No rashes, lesions or ulcers Psychiatry: Judgement and insight appear normal. Mood & affect appropriate.    Data Reviewed:  Lab results reviewed.  Family Communication: None  Disposition: Status is: Inpatient Remains inpatient appropriate because: Severity of disease, IV treatment     Time spent: 35 minutes  Author: Murvin Mana, MD 03/08/2024 12:04 PM  For on call review [email or web address].

## 2024-03-08 NOTE — Progress Notes (Signed)
 Patient states she changed her mind and does not want the blood transfusion and refused the type and screen. She was advised if she changes her mind at any time she can let us  know.  Dontrez Pettis V Audry Kauzlarich

## 2024-03-08 NOTE — Progress Notes (Signed)
 After agreeing and signing consent to transfuse, she refuse type and screen to be done. Re-educated her to get transfusion. Stated she does not want lab to be drawn at this time. Dr. Laurita notified.

## 2024-03-08 NOTE — Anesthesia Postprocedure Evaluation (Signed)
 Anesthesia Post Note  Patient: Rebecca Callahan  Procedure(s) Performed: IRRIGATION AND DEBRIDEMENT WOUND (Left: Thigh)  Patient location during evaluation: PACU Anesthesia Type: General Level of consciousness: awake and alert Pain management: pain level controlled Vital Signs Assessment: post-procedure vital signs reviewed and stable Respiratory status: spontaneous breathing, nonlabored ventilation, respiratory function stable and patient connected to nasal cannula oxygen Cardiovascular status: blood pressure returned to baseline and stable Postop Assessment: no apparent nausea or vomiting Anesthetic complications: no   No notable events documented.   Last Vitals:  Vitals:   03/08/24 0731 03/08/24 1516  BP: 122/70 123/75  Pulse: 68 73  Resp: 18 18  Temp: 36.7 C 36.4 C  SpO2: 100% 100%    Last Pain:  Vitals:   03/08/24 1516  TempSrc: Oral  PainSc:                  Prentice Murphy

## 2024-03-08 NOTE — Plan of Care (Signed)
  Problem: Metabolic: Goal: Ability to maintain appropriate glucose levels will improve Outcome: Progressing   Problem: Skin Integrity: Goal: Risk for impaired skin integrity will decrease Outcome: Progressing   Problem: Safety: Goal: Ability to remain free from injury will improve Outcome: Progressing   Problem: Pain Managment: Goal: General experience of comfort will improve and/or be controlled Outcome: Progressing

## 2024-03-09 ENCOUNTER — Encounter: Payer: Self-pay | Admitting: Surgery

## 2024-03-09 DIAGNOSIS — D508 Other iron deficiency anemias: Secondary | ICD-10-CM | POA: Diagnosis not present

## 2024-03-09 DIAGNOSIS — E1065 Type 1 diabetes mellitus with hyperglycemia: Secondary | ICD-10-CM | POA: Diagnosis not present

## 2024-03-09 DIAGNOSIS — L02214 Cutaneous abscess of groin: Secondary | ICD-10-CM | POA: Diagnosis not present

## 2024-03-09 LAB — TYPE AND SCREEN
ABO/RH(D): B POS
Antibody Screen: NEGATIVE
Unit division: 0

## 2024-03-09 LAB — CBC
HCT: 27.2 % — ABNORMAL LOW (ref 36.0–46.0)
Hemoglobin: 8.1 g/dL — ABNORMAL LOW (ref 12.0–15.0)
MCH: 22.8 pg — ABNORMAL LOW (ref 26.0–34.0)
MCHC: 29.8 g/dL — ABNORMAL LOW (ref 30.0–36.0)
MCV: 76.6 fL — ABNORMAL LOW (ref 80.0–100.0)
Platelets: 216 K/uL (ref 150–400)
RBC: 3.55 MIL/uL — ABNORMAL LOW (ref 3.87–5.11)
RDW: 18.4 % — ABNORMAL HIGH (ref 11.5–15.5)
WBC: 4.3 K/uL (ref 4.0–10.5)
nRBC: 0 % (ref 0.0–0.2)

## 2024-03-09 LAB — BPAM RBC
Blood Product Expiration Date: 202511102359
ISSUE DATE / TIME: 202510182052
Unit Type and Rh: 7300

## 2024-03-09 LAB — AEROBIC CULTURE W GRAM STAIN (SUPERFICIAL SPECIMEN): Culture: NO GROWTH

## 2024-03-09 LAB — GLUCOSE, CAPILLARY: Glucose-Capillary: 197 mg/dL — ABNORMAL HIGH (ref 70–99)

## 2024-03-09 MED ORDER — VITAMIN B-12 1000 MCG PO TABS: 1000.0000 ug | ORAL_TABLET | Freq: Every day | ORAL | 0 refills | Status: DC

## 2024-03-09 MED ORDER — OXYCODONE HCL 5 MG PO TABS: 5.0000 mg | ORAL_TABLET | Freq: Four times a day (QID) | ORAL | 0 refills | Status: DC | PRN

## 2024-03-09 MED ORDER — INSULIN GLARGINE 100 UNIT/ML ~~LOC~~ SOLN
12.0000 [IU] | Freq: Every day | SUBCUTANEOUS | Status: DC
  Administered 2024-03-09: 12 [IU] via SUBCUTANEOUS
  Filled 2024-03-09: qty 0.12

## 2024-03-09 MED ORDER — FERROUS SULFATE 325 (65 FE) MG PO TBEC: 325.0000 mg | DELAYED_RELEASE_TABLET | Freq: Every day | ORAL | 0 refills | Status: AC

## 2024-03-09 MED ORDER — DOXYCYCLINE HYCLATE 100 MG PO TABS: 100.0000 mg | ORAL_TABLET | Freq: Two times a day (BID) | ORAL | 0 refills | Status: DC

## 2024-03-09 MED ORDER — CEPHALEXIN 500 MG PO CAPS: 500.0000 mg | ORAL_CAPSULE | Freq: Four times a day (QID) | ORAL | 0 refills | Status: DC

## 2024-03-09 NOTE — Plan of Care (Signed)

## 2024-03-09 NOTE — Discharge Summary (Signed)
 Physician Discharge Summary   Patient: Rebecca Callahan MRN: [id number] DOB: 12/30/1995  Admit date:     03/05/2024  Discharge date: 03/09/24  Discharge Physician: Murvin Mana   PCP: Hersey Norleen ORN, MD   Recommendations at discharge:   Follow-up with PCP in 1 week. Follow-up with hematology in 2 weeks. Follow-up with general surgery as scheduled. Follow-up with endocrinologist as soon as possible. Check homocystine level, still pending.  I have prescribed B12 x 2weeks, if this is elevated, we will need to continue  B12 supplement  Discharge Diagnoses: Principal Problem:   Groin abscess Active Problems:   Abscess of groin, right   Iron deficiency anemia   Uncontrolled type 1 diabetes mellitus with hyperglycemia, with long-term current use of insulin  (HCC)   Abscess of left thigh  Resolved Problems:   * No resolved hospital problems. *  Hospital Course: Rebecca Callahan is a 28 y.o. female with medical history significant of IDDM came in with worsening of left groin swelling and pain.  CT scan showed a groin cellulitis with abscess.  Patient was started on cefepime and doxycycline . I&D performed 10/17. Cultures so far has no growth.  Condition improved, will be continued on oral antibiotics for total course of 10 days patient also be followed by general surgery and PCP as outpatient.  Assessment and Plan: Left groin abscess. Patient has a fairly large induration in the left groin area, CT scan only showed a small abscess. Culture 10/11 was positive for MSSA, which is pansensitive. I&D performed on 10/17, culture is sent, pending results.  Gram stain showed rare gram-negative rods and gram-positive cocci.  Patient initially treated with cefepime and doxycycline .  Patient had small vein, could not tolerate IV antibiotics.  Changed to Keflex  and doxycycline . Wound culture finalized without growth.  Will continue finishing up total of 10 days antibiotics with Keflex   and doxycycline .  Patient has a follow-up visit scheduled with general surgery.   Iron deficient anemia. Globin level dropped down to 7.1, patient has significant iron deficiency with very lower ferritin.  Started on iron supplement B12 level borderline, check homocystine level still pending, received a 1 dose of injected B12. Patient currently is not in her menstrual period.,  No active bleeding. Hemoglobin today is at 7.0.  Recieved IV iron and another B12 today injection, homocystine level still pending.   Patient ended up had a unit of blood transfusion for hemoglobin of 6.8.  Patient appeared to have chronic anemia.  I will continue iron supplement, also prescribed 2 weeks of B12.  Please follow-up on homocystine level.  Patient was also referred to hematology.  Uncontrolled type 1 diabetes with hyperglycemia and hypoglycemia Patient has an endocrinologist following, she has infrequent hypoglycemia at home.  Have resumed his home dose.  Advised her to follow-up with endocrinologist.   Pseudohyponatremia. Metabolic acidosis. Condition all improved.        Consultants: General surgery Procedures performed: I&D  Disposition: Home Diet recommendation:  Discharge Diet Orders (From admission, onward)     Start     Ordered   03/09/24 0000  Diet Carb Modified        03/09/24 0914   03/07/24 0000  Diet - low sodium heart healthy        03/07/24 0954           Carb modified diet DISCHARGE MEDICATION: Allergies as of 03/09/2024       Reactions   Amoxicillin Rash  Medication List     STOP taking these medications    cefUROXime  250 MG tablet Commonly known as: CEFTIN    HumaLOG  Mix 75/25 KwikPen (75-25) 100 UNIT/ML KwikPen Generic drug: Insulin  Lispro Prot & Lispro   ibuprofen 800 MG tablet Commonly known as: ADVIL   NovoLOG  Mix 70/30 FlexPen (70-30) 100 UNIT/ML FlexPen Generic drug: insulin  aspart protamine - aspart   phenazopyridine  97 MG  tablet Commonly known as: PYRIDIUM    sulfamethoxazole -trimethoprim  800-160 MG tablet Commonly known as: BACTRIM  DS   valACYclovir 500 MG tablet Commonly known as: VALTREX       TAKE these medications    Blood Glucose Monitoring Suppl Devi 1 each by Does not apply route in the morning, at noon, and at bedtime. May substitute to any manufacturer covered by patient's insurance.   BLOOD GLUCOSE TEST STRIPS Strp 1 each by In Vitro route in the morning, at noon, and at bedtime. May substitute to any manufacturer covered by patient's insurance.   cephALEXin  500 MG capsule Commonly known as: KEFLEX  Take 1 capsule (500 mg total) by mouth every 6 (six) hours for 6 days.   cyanocobalamin 1000 MCG tablet Commonly known as: VITAMIN B12 Take 1 tablet (1,000 mcg total) by mouth daily for 14 days.   doxycycline  100 MG tablet Commonly known as: VIBRA -TABS Take 1 tablet (100 mg total) by mouth every 12 (twelve) hours for 6 days.   ferrous sulfate 325 (65 FE) MG EC tablet Take 1 tablet (325 mg total) by mouth daily with breakfast.   insulin  lispro 100 UNIT/ML KwikPen Commonly known as: HUMALOG  3 (three) times daily. Pt stated she takes by calories counts. Did not provide units when asked.   Lantus SoloStar 100 UNIT/ML Solostar Pen Generic drug: insulin  glargine Inject 12 Units into the skin at bedtime.   oxyCODONE  5 MG immediate release tablet Commonly known as: Oxy IR/ROXICODONE  Take 1 tablet (5 mg total) by mouth every 6 (six) hours as needed for moderate pain (pain score 4-6) or severe pain (pain score 7-10).   TechLite Pen Needles 32G X 4 MM Misc Generic drug: Insulin  Pen Needle use with humalog  kwikpen mix 75/25 twice daily   Insulin  Pen Needle 32G X 4 MM Misc use with humalog  kwikpen mix 75/25 twice daily               Discharge Care Instructions  (From admission, onward)           Start     Ordered   03/09/24 0000  Discharge wound care:       Comments:  Please pack left thigh wound w 1/2 inch packing and cover with gauze and tape. May start 03/08/24, please teach family about packing wound daily.   03/09/24 0914   03/07/24 0000  Discharge wound care:       Comments: Daily pack wounds with 1/2 inch packing, may shower before changing dressings and then replace packing and dressing.   03/07/24 9045            Follow-up Information     Jordis Laneta FALCON, MD Follow up on 03/24/2024.   Specialty: General Surgery Contact information: [address] [place] [address] [phone]         Hersey Norleen ORN, MD Follow up in 1 week(s).   Specialty: Family Medicine Contact information: [address] [place] [address] [phone]         Babara Call, MD Follow up in 2 week(s).  Specialty: Oncology Contact information: [address] [place] [address] [phone]                Discharge Exam: Fredricka Weights   03/05/24 0641 03/07/24 0819  Weight: 72.1 kg 72.1 kg   General exam: Appears calm and comfortable  Respiratory system: Clear to auscultation. Respiratory effort normal. Cardiovascular system: S1 & S2 heard, RRR. No JVD, murmurs, rubs, gallops or clicks. No pedal edema. Gastrointestinal system: Abdomen is nondistended, soft and nontender. No organomegaly or masses felt. Normal bowel sounds heard. Central nervous system: Alert and oriented. No focal neurological deficits. Extremities: Symmetric 5 x 5 power. Skin: No rashes, lesions or ulcers Psychiatry: Judgement and insight appear normal. Mood & affect appropriate.    Condition at discharge: good  The results of significant diagnostics from this hospitalization (including imaging, microbiology, ancillary and laboratory) are listed below for reference.   Imaging Studies: CT PELVIS W CONTRAST Result Date: 03/05/2024 EXAM: CT Pelvis, With IV Contrast 03/05/2024 10:12:56 AM TECHNIQUE: Axial images were acquired  through the pelvis with IV contrast. 100mL iohexol  (OMNIPAQUE ) 300 MG/ML solution was administered intravenously. Reformatted images were reviewed. Automated exposure control, iterative reconstruction, and/or weight based adjustment of the mA/kV was utilized to reduce the radiation dose to as low as reasonably achievable. COMPARISON: CT of the abdomen and pelvis dated 12/09/2023. CLINICAL HISTORY: abscess, cellulitis left upper leg and pubis. Abscess abscess, cellulitis left upper leg and pubis. Abscess FINDINGS: BONES: No acute fracture or focal osseous lesion. JOINTS: No dislocation. The joint spaces are normal. SOFT TISSUES: Mild thickening of the skin present anteromedially within the proximal thigh with stranding of the underlying subcutaneous fat. There is no appreciable fluid collection. There is no appreciable involvement of the mons pubis or of the adductor musculature of the left hip. There are several shotty inguinal lymph nodes present bilaterally, more pronounced on the left, which have increased in size since the previous study and measure up to approximately 18 mm in long axis. INTRAPELVIC CONTENTS: The pelvic viscera are unremarkable. IMPRESSION: 1. Mild thickening of the skin anteromedially within the proximal thigh with stranding of the underlying subcutaneous fat, without appreciable fluid collection. Findings are compatible with cellulitis. 2. Several shotty inguinal lymph nodes bilaterally, more pronounced on the left, which have increased in size since the previous study and measure up to approximately 18 mm in long axis. Electronically signed by: Evalene Coho MD 03/05/2024 10:44 AM EDT RP Workstation: [id number]   US  LT LOWER EXTREM LTD SOFT TISSUE NON VASCULAR Result Date: 03/05/2024 CLINICAL DATA:  Cellulitis EXAM: ULTRASOUND LEFT LOWER EXTREMITY LIMITED TECHNIQUE: Ultrasound examination of the lower extremity soft tissues was performed in the area of clinical concern. COMPARISON:   None Available. FINDINGS: Focused ultrasound exam was performed in the region of patient concern over the groin area. This reveals subcutaneous edema and a small complex fluid collection measuring 9 x 9 x 8 mm. Color Doppler assessment shows hyperemia in the soft tissues adjacent to the collection. IMPRESSION: 9 x 9 x 8 mm complex fluid collection in the region of patient concern. Imaging features are compatible with a small abscess on a background of cellulitis. Close follow-up recommended and follow-up CT or MRI with contrast could be used to more definitively characterize as clinically warranted. Electronically Signed   By: Camellia Candle M.D.   On: 03/05/2024 09:16    Microbiology: Results for orders placed or performed during the hospital encounter of 03/05/24  Blood culture (routine x 2)  Status: None (Preliminary result)   Collection Time: 03/05/24 12:21 PM   Specimen: BLOOD  Result Value Ref Range Status   Specimen Description BLOOD LEFT ANTECUBITAL  Final   Special Requests   Final    BOTTLES DRAWN AEROBIC AND ANAEROBIC Blood Culture results may not be optimal due to an inadequate volume of blood received in culture bottles   Culture   Final    NO GROWTH 4 DAYS Performed at [facility], [address] [place], [place] [address]    Report Status PENDING  Incomplete  Blood culture (routine x 2)     Status: None (Preliminary result)   Collection Time: 03/05/24 12:21 PM   Specimen: BLOOD  Result Value Ref Range Status   Specimen Description BLOOD RIGHT ANTECUBITAL  Final   Special Requests   Final    BOTTLES DRAWN AEROBIC AND ANAEROBIC Blood Culture results may not be optimal due to an inadequate volume of blood received in culture bottles   Culture   Final    NO GROWTH 4 DAYS Performed at [facility], [address] [place], [place] [address]    Report Status PENDING  Incomplete  Aerobic Culture w Gram Stain (superficial specimen)     Status: None    Collection Time: 03/07/24  9:27 AM   Specimen: Thigh; Abscess  Result Value Ref Range Status   Specimen Description   Final    ABSCESS Performed at [facility], [address], [place], [place] [address]    Special Requests   Final     LEFT THIGH Performed at [facility], [address] [place], [place] [address]    Gram Stain   Final    FEW WBC PRESENT, PREDOMINANTLY PMN RARE GRAM POSITIVE COCCI RARE GRAM NEGATIVE RODS    Culture   Final    NO GROWTH 2 DAYS Performed at [facility], [address], [place], [place] [address]    Report Status 03/09/2024 FINAL  Final  MRSA Next Gen by PCR, Nasal     Status: None   Collection Time: 03/07/24  7:24 PM   Specimen: Nasal Mucosa; Nasal Swab  Result Value Ref Range Status   MRSA by PCR Next Gen NOT DETECTED NOT DETECTED Final    Comment: (NOTE) The GeneXpert MRSA Assay (FDA approved for NASAL specimens only), is one component of a comprehensive MRSA colonization surveillance program. It is not intended to diagnose MRSA infection nor to guide or monitor treatment for MRSA infections. Test performance is not FDA approved in patients less than 37 years old. Performed at [facility], [address] [place], [place] [address]     Labs: CBC: Recent Labs  Lab 03/05/24 [phone] 03/06/24 0850 03/07/24 0655 03/08/24 0450 03/08/24 1326 03/09/24 0553  WBC 5.3 4.4 3.8* 4.3  --  4.3  NEUTROABS 3.8  --   --   --   --   --   HGB 7.8* 7.1* 7.1* 7.0* 6.8* 8.1*  HCT 26.8* 24.0* 24.1* 24.3*  --  27.2*  MCV 73.4* 73.8* 73.0* 73.2*  --  76.6*  PLT 173 161 166 173  --  216   Basic Metabolic Panel: Recent Labs  Lab 03/05/24 0746 03/07/24 0655  NA 134* 135  K 3.6 4.0  CL 101 107  CO2 18* 22  GLUCOSE 416* 300*  BUN 11 12  CREATININE 0.77 0.59  CALCIUM 8.8* 8.5*  MG  --  2.0   Liver  Function Tests: No results for input(s): AST, ALT, ALKPHOS, BILITOT, PROT, ALBUMIN in the last 168  hours. CBG: Recent Labs  Lab 03/08/24 0729 03/08/24 1154 03/08/24 1713 03/08/24 2006 03/08/24 2043  GLUCAP 282* 205* 153* 44* 109*    Discharge time spent: 35 minutes.  Signed: Murvin Mana, MD [facility] 03/09/2024

## 2024-03-10 LAB — CULTURE, BLOOD (ROUTINE X 2)
Culture: NO GROWTH
Culture: NO GROWTH

## 2024-03-10 LAB — SURGICAL PATHOLOGY

## 2024-03-11 LAB — HOMOCYSTEINE: Homocysteine: 16.1 umol/L — ABNORMAL HIGH (ref 0.0–14.5)

## 2024-03-15 ENCOUNTER — Observation Stay
Admission: EM | Admit: 2024-03-15 | Discharge: 2024-03-16 | Disposition: A | Discharge status: Home / Self Care | Attending: Internal Medicine | Admitting: Internal Medicine

## 2024-03-15 ENCOUNTER — Emergency Department

## 2024-03-15 ENCOUNTER — Other Ambulatory Visit: Payer: Self-pay

## 2024-03-15 DIAGNOSIS — K3184 Gastroparesis: Secondary | ICD-10-CM | POA: Insufficient documentation

## 2024-03-15 DIAGNOSIS — R109 Unspecified abdominal pain: Secondary | ICD-10-CM

## 2024-03-15 DIAGNOSIS — R112 Nausea with vomiting, unspecified: Secondary | ICD-10-CM | POA: Diagnosis present

## 2024-03-15 DIAGNOSIS — Z862 Personal history of diseases of the blood and blood-forming organs and certain disorders involving the immune mechanism: Secondary | ICD-10-CM | POA: Diagnosis not present

## 2024-03-15 DIAGNOSIS — E1143 Type 2 diabetes mellitus with diabetic autonomic (poly)neuropathy: Principal | ICD-10-CM | POA: Diagnosis present

## 2024-03-15 DIAGNOSIS — Z794 Long term (current) use of insulin: Secondary | ICD-10-CM | POA: Diagnosis not present

## 2024-03-15 DIAGNOSIS — Z872 Personal history of diseases of the skin and subcutaneous tissue: Secondary | ICD-10-CM | POA: Diagnosis not present

## 2024-03-15 LAB — COMPREHENSIVE METABOLIC PANEL WITH GFR
ALT: 19 U/L (ref 0–44)
AST: 30 U/L (ref 15–41)
Albumin: 3.9 g/dL (ref 3.5–5.0)
Alkaline Phosphatase: 79 U/L (ref 38–126)
Anion gap: 11 (ref 5–15)
BUN: 10 mg/dL (ref 6–20)
CO2: 25 mmol/L (ref 22–32)
Calcium: 9 mg/dL (ref 8.9–10.3)
Chloride: 101 mmol/L (ref 98–111)
Creatinine, Ser: 0.47 mg/dL (ref 0.44–1.00)
GFR, Estimated: >60 mL/min (ref >60)
Glucose, Bld: 331 mg/dL — ABNORMAL HIGH (ref 70–99)
Potassium: 4.2 mmol/L (ref 3.5–5.1)
Sodium: 137 mmol/L (ref 135–145)
Total Bilirubin: 0.7 mg/dL (ref 0.0–1.2)
Total Protein: 8 g/dL (ref 6.5–8.1)

## 2024-03-15 LAB — URINALYSIS, COMPLETE (UACMP) WITH MICROSCOPIC
Bacteria, UA: NONE SEEN
Bilirubin Urine: NEGATIVE
Glucose, UA: >=500 mg/dL — AB
Hgb urine dipstick: NEGATIVE
Ketones, ur: 20 mg/dL — AB
Leukocytes,Ua: NEGATIVE
Nitrite: NEGATIVE
Protein, ur: NEGATIVE mg/dL
Specific Gravity, Urine: 1.032 — ABNORMAL HIGH (ref 1.005–1.030)
pH: 7 (ref 5.0–8.0)

## 2024-03-15 LAB — LIPASE, BLOOD: Lipase: 23 U/L (ref 11–51)

## 2024-03-15 LAB — CBG MONITORING, ED: Glucose-Capillary: 294 mg/dL — ABNORMAL HIGH (ref 70–99)

## 2024-03-15 LAB — CBC
HCT: 33.3 % — ABNORMAL LOW (ref 36.0–46.0)
Hemoglobin: 9.8 g/dL — ABNORMAL LOW (ref 12.0–15.0)
MCH: 23.2 pg — ABNORMAL LOW (ref 26.0–34.0)
MCHC: 29.4 g/dL — ABNORMAL LOW (ref 30.0–36.0)
MCV: 78.9 fL — ABNORMAL LOW (ref 80.0–100.0)
Platelets: 410 K/uL — ABNORMAL HIGH (ref 150–400)
RBC: 4.22 MIL/uL (ref 3.87–5.11)
RDW: 21.6 % — ABNORMAL HIGH (ref 11.5–15.5)
WBC: 6.2 K/uL (ref 4.0–10.5)
nRBC: 0 % (ref 0.0–0.2)

## 2024-03-15 LAB — PHOSPHORUS: Phosphorus: 3.1 mg/dL (ref 2.5–4.6)

## 2024-03-15 LAB — HCG, QUANTITATIVE, PREGNANCY: hCG, Beta Chain, Quant, S: <1 m[IU]/mL (ref <5)

## 2024-03-15 LAB — GLUCOSE, CAPILLARY: Glucose-Capillary: 330 mg/dL — ABNORMAL HIGH (ref 70–99)

## 2024-03-15 LAB — MAGNESIUM: Magnesium: 2 mg/dL (ref 1.7–2.4)

## 2024-03-15 MED ORDER — LACTATED RINGERS IV SOLN: INTRAVENOUS | Status: DC

## 2024-03-15 MED ORDER — SODIUM CHLORIDE 0.9 % IV SOLN
12.5000 mg | Freq: Four times a day (QID) | INTRAVENOUS | Status: DC | PRN
  Administered 2024-03-15: 12.5 mg via INTRAVENOUS
  Filled 2024-03-15: qty 12.5
  Filled 2024-03-15: qty 0.5

## 2024-03-15 MED ORDER — ENOXAPARIN SODIUM 40 MG/0.4ML IJ SOSY
40.0000 mg | PREFILLED_SYRINGE | INTRAMUSCULAR | Status: DC
  Administered 2024-03-15: 40 mg via SUBCUTANEOUS
  Filled 2024-03-15: qty 0.4

## 2024-03-15 MED ORDER — SODIUM CHLORIDE 0.9 % IV BOLUS
1000.0000 mL | Freq: Once | INTRAVENOUS | Status: AC
  Administered 2024-03-15: 1000 mL via INTRAVENOUS

## 2024-03-15 MED ORDER — ONDANSETRON HCL 4 MG/2ML IJ SOLN
4.0000 mg | Freq: Once | INTRAMUSCULAR | Status: AC | PRN
  Administered 2024-03-15: 4 mg via INTRAVENOUS
  Filled 2024-03-15: qty 2

## 2024-03-15 MED ORDER — GABAPENTIN 100 MG PO CAPS
200.0000 mg | ORAL_CAPSULE | Freq: Three times a day (TID) | ORAL | Status: DC
  Filled 2024-03-15: qty 2

## 2024-03-15 MED ORDER — VITAMIN B-12 1000 MCG PO TABS: 1000.0000 ug | ORAL_TABLET | Freq: Every day | ORAL | Status: DC

## 2024-03-15 MED ORDER — ONDANSETRON HCL 4 MG/2ML IJ SOLN: 4.0000 mg | Freq: Four times a day (QID) | INTRAMUSCULAR | Status: DC | PRN

## 2024-03-15 MED ORDER — ALBUTEROL SULFATE (2.5 MG/3ML) 0.083% IN NEBU: 2.5000 mg | INHALATION_SOLUTION | RESPIRATORY_TRACT | Status: DC | PRN

## 2024-03-15 MED ORDER — FERROUS SULFATE 325 (65 FE) MG PO TABS: 325.0000 mg | ORAL_TABLET | Freq: Every day | ORAL | Status: DC

## 2024-03-15 MED ORDER — INSULIN GLARGINE-YFGN 100 UNIT/ML ~~LOC~~ SOLN
12.0000 [IU] | Freq: Every day | SUBCUTANEOUS | Status: DC
  Administered 2024-03-16: 12 [IU] via SUBCUTANEOUS
  Filled 2024-03-15 (×3): qty 0.12

## 2024-03-15 MED ORDER — INSULIN ASPART 100 UNIT/ML IJ SOLN: 0.0000 [IU] | Freq: Three times a day (TID) | INTRAMUSCULAR | Status: DC

## 2024-03-15 MED ORDER — ONDANSETRON 4 MG PO TBDP: 4.0000 mg | ORAL_TABLET | Freq: Three times a day (TID) | ORAL | 0 refills | Status: AC | PRN

## 2024-03-15 MED ORDER — IOHEXOL 300 MG/ML  SOLN
100.0000 mL | Freq: Once | INTRAMUSCULAR | Status: AC | PRN
  Administered 2024-03-15: 100 mL via INTRAVENOUS

## 2024-03-15 MED ORDER — METOCLOPRAMIDE HCL 5 MG/ML IJ SOLN
10.0000 mg | Freq: Once | INTRAMUSCULAR | Status: AC
  Administered 2024-03-15: 10 mg via INTRAVENOUS
  Filled 2024-03-15: qty 2

## 2024-03-15 MED ORDER — ACETAMINOPHEN 325 MG PO TABS: 650.0000 mg | ORAL_TABLET | Freq: Four times a day (QID) | ORAL | Status: DC | PRN

## 2024-03-15 MED ORDER — ACETAMINOPHEN 650 MG RE SUPP: 650.0000 mg | Freq: Four times a day (QID) | RECTAL | Status: DC | PRN

## 2024-03-15 MED ORDER — ONDANSETRON HCL 4 MG/2ML IJ SOLN
4.0000 mg | Freq: Once | INTRAMUSCULAR | Status: AC
  Administered 2024-03-15: 4 mg via INTRAVENOUS
  Filled 2024-03-15: qty 2

## 2024-03-15 MED ORDER — PROCHLORPERAZINE EDISYLATE 10 MG/2ML IJ SOLN
10.0000 mg | Freq: Once | INTRAMUSCULAR | Status: AC
  Administered 2024-03-15: 10 mg via INTRAVENOUS
  Filled 2024-03-15: qty 2

## 2024-03-15 MED ORDER — DROPERIDOL 2.5 MG/ML IJ SOLN
1.2500 mg | Freq: Once | INTRAMUSCULAR | Status: AC
Start: 2024-03-15 — End: 2024-03-15
  Administered 2024-03-15: 1.25 mg via INTRAVENOUS
  Filled 2024-03-15: qty 2

## 2024-03-15 MED ORDER — ONDANSETRON HCL 4 MG PO TABS: 4.0000 mg | ORAL_TABLET | Freq: Four times a day (QID) | ORAL | Status: DC | PRN

## 2024-03-15 NOTE — Discharge Instructions (Signed)
 Please keep yourself hydrated.  I prescribed you some Zofran  for your nausea.  Please follow-up with your primary care doctor next week to get reassessed.

## 2024-03-15 NOTE — ED Provider Notes (Addendum)
 [facility] Provider Note    Event Date/Time   First MD Initiated Contact with Patient 03/15/24 1752     (approximate)   History   Emesis   HPI  Rebecca Callahan is a 28 y.o. female with history of diabetes presenting with nausea vomiting and diffuse abdominal pain.  States that it started today.  States that she has been taking medications for a thigh abscess that have been drained.  She says she feels chills, subjectively hot.  No diarrhea or urinary symptoms.  Denies marijuana use.  On independent review, she was discharged on the 19th after she was admitted for thigh abscess requiring I&D by surgery.  Was discharged on Keflex  and doxycycline  for total of 10 days.     Physical Exam   Triage Vital Signs: ED Triage Vitals  Encounter Vitals Group     BP 03/15/24 1530 (!) 143/90     Girls Systolic BP Percentile --      Girls Diastolic BP Percentile --      Boys Systolic BP Percentile --      Boys Diastolic BP Percentile --      Pulse Rate 03/15/24 1530 66     Resp 03/15/24 1530 18     Temp 03/15/24 1530 98.6 F (37 C)     Temp Source 03/15/24 1530 Oral     SpO2 03/15/24 1530 100 %     Weight --      Height 03/15/24 1534 5' 8 (1.727 m)     Head Circumference --      Peak Flow --      Pain Score 03/15/24 1533 0     Pain Loc --      Pain Education --      Exclude from Growth Chart --     Most recent vital signs: Vitals:   03/15/24 2127 03/15/24 2139  BP:    Pulse: 63   Resp: 20   Temp:  98.1 F (36.7 C)  SpO2: 96%      General: Awake, no distress.  CV:  Good peripheral perfusion.  Resp:  Normal effort.  Abd:  No distention.  Soft, mild diffuse tenderness without guarding Other:  Her thigh I&D sites were evaluated, no active drainage, it is clean, no surrounding erythema or induration.  The sites are nontender on palpation.   ED Results / Procedures / Treatments   Labs (all labs ordered are listed, but only abnormal  results are displayed) Labs Reviewed  COMPREHENSIVE METABOLIC PANEL WITH GFR - Abnormal; Notable for the following components:      Result Value   Glucose, Bld 331 (*)    All other components within normal limits  CBC - Abnormal; Notable for the following components:   Hemoglobin 9.8 (*)    HCT 33.3 (*)    MCV 78.9 (*)    MCH 23.2 (*)    MCHC 29.4 (*)    RDW 21.6 (*)    Platelets 410 (*)    All other components within normal limits  URINALYSIS, COMPLETE (UACMP) WITH MICROSCOPIC - Abnormal; Notable for the following components:   Color, Urine COLORLESS (*)    APPearance CLEAR (*)    Specific Gravity, Urine 1.032 (*)    Glucose, UA >=500 (*)    Ketones, ur 20 (*)    All other components within normal limits  CBG MONITORING, ED - Abnormal; Notable for the following components:   Glucose-Capillary 294 (*)  All other components within normal limits  LIPASE, BLOOD  HCG, QUANTITATIVE, PREGNANCY  MAGNESIUM   PHOSPHORUS     EKG  EKG shows, EKG shows sinus rhythm, rate 61, normal QS, normal QTc, no obvious ischemic ST elevation, T wave flattening to V2, 3, no prior to compare  RADIOLOGY On my independent interpretation, CT without obvious colitis   PROCEDURES:  Critical Care performed: No  Procedures   MEDICATIONS ORDERED IN ED: Medications  prochlorperazine (COMPAZINE) injection 10 mg (has no administration in time range)  ondansetron  (ZOFRAN ) injection 4 mg (4 mg Intravenous Given 03/15/24 1537)  sodium chloride  0.9 % bolus 1,000 mL (1,000 mLs Intravenous New Bag/Given 03/15/24 1806)  ondansetron  (ZOFRAN ) injection 4 mg (4 mg Intravenous Given 03/15/24 1807)  metoCLOPramide (REGLAN) injection 10 mg (10 mg Intravenous Given 03/15/24 1825)  iohexol  (OMNIPAQUE ) 300 MG/ML solution 100 mL (100 mLs Intravenous Contrast Given 03/15/24 1915)  droperidol (INAPSINE) 2.5 MG/ML injection 1.25 mg (1.25 mg Intravenous Given 03/15/24 2005)     IMPRESSION / MDM / ASSESSMENT AND  PLAN / ED COURSE  I reviewed the triage vital signs and the nursing notes.                              Differential diagnosis includes, but is not limited to, gastroparesis, hyperemesis, viral illness, colitis, diverticulitis, dehydration, electrolyte derangements.  Did also consider DKA.  Labs, UA, CT abdomen pelvis, IV fluids, IV antiemetics.  Patient's presentation is most consistent with acute presentation with potential threat to life or bodily function.  Independent interpretation of labs and imaging below.  Patient asking for additional antiemetics, her QTc is 444, will give her droperidol.  CT imaging without acute findings, showed possible cystitis but UA is not consistent with UTI.  Patient was feeling a lot better after droperidol, was asking for something to drink.  On reassessment she is throwing up again.  Given her intractable nausea vomiting, plan to have her admitted for further management.  Consult the hospitalist will admit the patient.  She is admitted.    Clinical Course as of 03/15/24 2153  Sat Mar 15, 2024  8191 Independent review of labs, no leukocytosis, electrolytes not severely deranged, LFTs are normal, glucose is elevated but no evidence of DKA.  Phosphorus and mag are normal, hCG is negative. [TT]  1954 CT ABDOMEN PELVIS W CONTRAST IMPRESSION: 1. Suspected cystitis. 2. Otherwise negative.   [TT]  2032 Urinalysis, Complete w Microscopic -Urine, Clean Catch(!) UA not consistent with UTI [TT]  2118 Blood glucose is downtrending.  Patient states that she felt lot better after droperidol.  Was asking for something to drink. [TT]    Clinical Course User Index [TT] Waymond Lorelle Cummins, MD     FINAL CLINICAL IMPRESSION(S) / ED DIAGNOSES   Final diagnoses:  Abdominal pain, unspecified abdominal location  Nausea and vomiting, unspecified vomiting type     Rx / DC Orders   ED Discharge Orders          Ordered    ondansetron  (ZOFRAN -ODT) 4 MG disintegrating  tablet  Every 8 hours PRN        03/15/24 2118             Note:  This document was prepared using Dragon voice recognition software and may include unintentional dictation errors.    Waymond Lorelle Cummins, MD 03/15/24 2120    Waymond Lorelle Cummins, MD 03/15/24 [phone]

## 2024-03-15 NOTE — ED Notes (Signed)
Stool sample in lab

## 2024-03-15 NOTE — ED Triage Notes (Signed)
 Pt to ed from home via POV for emesis since last night. Pt was DC from here on Sunday this week. Pt was placed on PO antibiotics and has been vomiting. Pt is caox4, in no acute distress and ambulatory to triage.

## 2024-03-15 NOTE — H&P (Signed)
 History and Physical    Patient: Rebecca Callahan FMW:[id number] DOB: 02/27/1996 DOA: 03/15/2024 DOS: the patient was seen and examined on 03/15/2024 PCP: Hersey Norleen ORN, MD  Patient coming from: Home  Chief Complaint:  Chief Complaint  Patient presents with   Emesis   HPI: Rebecca Callahan is a 28 y.o. female with medical history significant of diabetes mellitus on insulin  therapy, status post recent I&D (10/17) for groin cellulitis/abscess, iron deficiency anemia.  Patient was discharged home on PO Keflex , doxycycline  and oxycodone .  Patient presents to the emergency room on account of intractable nausea and vomiting.  On account of the symptoms, patient has been unable to keep food or medications down.  She denies any drug allergies.  She denies any THC use.  In the ED, CT scan of the abdomen obtained were unremarkable except for bladder findings suggesting cystitis.  Urinalysis however inconsistent with UTI.  Patient was given trial of droperidol, Phenergan and Zofran  in the ED.  Despite these treatment, patient continued to have intractable nausea.  She was referred to hospitalist service for admission  Review of Systems: As mentioned in the history of present illness. All other systems reviewed and are negative. Past Medical History:  Diagnosis Date   Diabetes mellitus without complication (HCC)    Past Surgical History:  Procedure Laterality Date   INCISION AND DRAINAGE OF WOUND Left 03/07/2024   Procedure: IRRIGATION AND DEBRIDEMENT WOUND;  Surgeon: Jordis Laneta FALCON, MD;  Location: [facility] ORS;  Service: General;  Laterality: Left;   Social History:  reports that she has never smoked. She has never used smokeless tobacco. She reports current alcohol use. She reports current drug use. Drug: Marijuana.  Allergies  Allergen Reactions   Amoxicillin Rash    History reviewed. No pertinent family history.  Prior to Admission medications   Medication Sig Start Date  End Date Taking? Authorizing Provider  ondansetron  (ZOFRAN -ODT) 4 MG disintegrating tablet Take 1 tablet (4 mg total) by mouth every 8 (eight) hours as needed for nausea or vomiting. 03/15/24  Yes Waymond Lorelle Cummins, MD  Blood Glucose Monitoring Suppl DEVI 1 each by Does not apply route in the morning, at noon, and at bedtime. May substitute to any manufacturer covered by patient's insurance. 08/09/22   Patel, Sona, MD  cephALEXin  (KEFLEX ) 500 MG capsule Take 1 capsule (500 mg total) by mouth every 6 (six) hours for 6 days. 03/09/24 03/15/24  Laurita Pillion, MD  cyanocobalamin (VITAMIN B12) 1000 MCG tablet Take 1 tablet (1,000 mcg total) by mouth daily for 14 days. 03/09/24 03/23/24  Laurita Pillion, MD  doxycycline  (VIBRA -TABS) 100 MG tablet Take 1 tablet (100 mg total) by mouth every 12 (twelve) hours for 6 days. 03/09/24 03/15/24  Laurita Pillion, MD  ferrous sulfate 325 (65 FE) MG EC tablet Take 1 tablet (325 mg total) by mouth daily with breakfast. 03/09/24 04/08/24  Laurita Pillion, MD  Glucose Blood (BLOOD GLUCOSE TEST STRIPS) STRP 1 each by In Vitro route in the morning, at noon, and at bedtime. May substitute to any manufacturer covered by patient's insurance. 08/09/22   Patel, Sona, MD  insulin  lispro (HUMALOG ) 100 UNIT/ML KwikPen 3 (three) times daily. Pt stated she takes by calories counts. Did not provide units when asked.    [provider]  Insulin  Pen Needle 32G X 4 MM MISC use with humalog  kwikpen mix 75/25 twice daily 08/09/22   Patel, Sona, MD  Insulin  Pen Needle 32G X 4 MM MISC use with  humalog  kwikpen mix 75/25 twice daily 08/09/22   Patel, Sona, MD  LANTUS SOLOSTAR 100 UNIT/ML Solostar Pen Inject 12 Units into the skin at bedtime. 09/13/22   [provider]  oxyCODONE  (OXY IR/ROXICODONE ) 5 MG immediate release tablet Take 1 tablet (5 mg total) by mouth every 6 (six) hours as needed for moderate pain (pain score 4-6) or severe pain (pain score 7-10). 03/09/24   Laurita Pillion, MD     Physical Exam: Vitals:   03/15/24 1929 03/15/24 1955 03/15/24 2127 03/15/24 2139  BP:  130/86    Pulse: 61 69 63   Resp: 16 19 20    Temp:    98.1 F (36.7 C)  TempSrc:    Oral  SpO2: 100% 100% 96%   Height:       General: patient is a young female, who was seen laying in bed.  Mild discomfort due to nausea. HEENT: Oral mucosa dry.  Pupils equal round reactive light accommodation Neck: Supple with no JVD Chest: Clinically clear with no added sounds. Abdomen: Soft flat nontender.  Bowel sounds present Extremities: Left thigh with dressing in place. CNS shows no focal deficit.  Data Reviewed: Sodium, 137, potassium 4.2, chloride 101, bicarb 25, glucose 331, BUN 10, creatinine 0.47, calcium 9.0 WBC 6.2, hemoglobin 9.8, hematocrit 33, MCV 78.9, platelet count is 410 Urinalysis unremarkable CT scan reviewed: Stomach demonstrated no acute abnormality.  No bowel obstruction noted.  Suspected cystitis.   Assessment and Plan: 29 year old female with history of poorly controlled diabetes mellitus, recent admission for thigh abscess.  Discharged on p.o. antibiotics and oxycodone .  Presents on account of intractable nausea and vomiting.  Abdominal CT scan unremarkable.  #1.  Intractable nausea vomiting: Etiology likely due to medications versus diabetic gastroparesis.  Patient was given trial of droperidol, Phenergan and Zofran  in the ED with minimal improvement.  Will hold off on opiates.  Will use alternate agents for pain control including gabapentin.   #2.  Insulin -dependent diabetes mellitus: Last A1c was noted to be 9.8 suggesting poor control.  Continue on insulin  glargine 12 units.  Insulin  sliding scale will be instituted per protocol.  #3.  History of iron deficiency anemia: Continue with iron replacement.  Hemoglobin likely improved due to hemoconcentration.  #4.    Advance Care Planning:   Code Status: Full Code   Consults: None  Family Communication: None  Severity  of Illness: The appropriate patient status for this patient is INPATIENT. Inpatient status is judged to be reasonable and necessary in order to provide the required intensity of service to ensure the patient's safety. The patient's presenting symptoms, physical exam findings, and initial radiographic and laboratory data in the context of their chronic comorbidities is felt to place them at high risk for further clinical deterioration. Furthermore, it is not anticipated that the patient will be medically stable for discharge from the hospital within 2 midnights of admission.   * I certify that at the point of admission it is my clinical judgment that the patient will require inpatient hospital care spanning beyond 2 midnights from the point of admission due to high intensity of service, high risk for further deterioration and high frequency of surveillance required.*  Author: Maude MARLA Dart, MD 03/15/2024 9:59 PM  For on call review [email or web address].

## 2024-03-15 NOTE — ED Notes (Signed)
 Pt c/o severe pelvic cramps and N/V/D. Pt informed to provide urine and stool samples and helped to the toilet.

## 2024-03-15 NOTE — ED Notes (Signed)
Pt throwing up at this time.

## 2024-03-16 ENCOUNTER — Encounter: Payer: Self-pay | Admitting: Internal Medicine

## 2024-03-16 DIAGNOSIS — K3184 Gastroparesis: Secondary | ICD-10-CM | POA: Diagnosis not present

## 2024-03-16 DIAGNOSIS — R109 Unspecified abdominal pain: Secondary | ICD-10-CM

## 2024-03-16 DIAGNOSIS — R112 Nausea with vomiting, unspecified: Secondary | ICD-10-CM

## 2024-03-16 DIAGNOSIS — E1143 Type 2 diabetes mellitus with diabetic autonomic (poly)neuropathy: Secondary | ICD-10-CM | POA: Diagnosis not present

## 2024-03-16 LAB — URINE DRUG SCREEN, QUALITATIVE (ARMC ONLY)
Amphetamines, Ur Screen: NOT DETECTED
Barbiturates, Ur Screen: NOT DETECTED
Benzodiazepine, Ur Scrn: NOT DETECTED
Cannabinoid 50 Ng, Ur ~~LOC~~: POSITIVE — AB
Cocaine Metabolite,Ur ~~LOC~~: NOT DETECTED
MDMA (Ecstasy)Ur Screen: NOT DETECTED
Methadone Scn, Ur: NOT DETECTED
Opiate, Ur Screen: NOT DETECTED
Phencyclidine (PCP) Ur S: NOT DETECTED
Tricyclic, Ur Screen: NOT DETECTED

## 2024-03-16 LAB — BASIC METABOLIC PANEL WITH GFR
Anion gap: 11 (ref 5–15)
BUN: 11 mg/dL (ref 6–20)
CO2: 24 mmol/L (ref 22–32)
Calcium: 8.5 mg/dL — ABNORMAL LOW (ref 8.9–10.3)
Chloride: 104 mmol/L (ref 98–111)
Creatinine, Ser: 0.59 mg/dL (ref 0.44–1.00)
GFR, Estimated: >60 mL/min (ref >60)
Glucose, Bld: 261 mg/dL — ABNORMAL HIGH (ref 70–99)
Potassium: 3.6 mmol/L (ref 3.5–5.1)
Sodium: 139 mmol/L (ref 135–145)

## 2024-03-16 LAB — GLUCOSE, CAPILLARY
Glucose-Capillary: 282 mg/dL — ABNORMAL HIGH (ref 70–99)
Glucose-Capillary: 257 mg/dL — ABNORMAL HIGH (ref 70–99)
Glucose-Capillary: 178 mg/dL — ABNORMAL HIGH (ref 70–99)

## 2024-03-16 LAB — CBC
HCT: 27.8 % — ABNORMAL LOW (ref 36.0–46.0)
Hemoglobin: 8.2 g/dL — ABNORMAL LOW (ref 12.0–15.0)
MCH: 22.8 pg — ABNORMAL LOW (ref 26.0–34.0)
MCHC: 29.5 g/dL — ABNORMAL LOW (ref 30.0–36.0)
MCV: 77.2 fL — ABNORMAL LOW (ref 80.0–100.0)
Platelets: 417 K/uL — ABNORMAL HIGH (ref 150–400)
RBC: 3.6 MIL/uL — ABNORMAL LOW (ref 3.87–5.11)
RDW: 21.3 % — ABNORMAL HIGH (ref 11.5–15.5)
WBC: 7.4 K/uL (ref 4.0–10.5)
nRBC: 0 % (ref 0.0–0.2)

## 2024-03-16 MED ORDER — INSULIN ASPART 100 UNIT/ML IJ SOLN
0.0000 [IU] | INTRAMUSCULAR | Status: DC
  Administered 2024-03-16: 5 [IU] via SUBCUTANEOUS
  Administered 2024-03-16: 2 [IU] via SUBCUTANEOUS
  Administered 2024-03-16: 5 [IU] via SUBCUTANEOUS
  Filled 2024-03-16 (×3): qty 1

## 2024-03-16 MED ORDER — INSULIN ASPART 100 UNIT/ML IJ SOLN: 0.0000 [IU] | Freq: Three times a day (TID) | INTRAMUSCULAR | Status: DC

## 2024-03-16 MED ORDER — CEFAZOLIN SODIUM-DEXTROSE 2-4 GM/100ML-% IV SOLN
2.0000 g | Freq: Three times a day (TID) | INTRAVENOUS | Status: DC
  Administered 2024-03-16 (×2): 2 g via INTRAVENOUS
  Filled 2024-03-16 (×4): qty 100

## 2024-03-16 MED ORDER — SODIUM CHLORIDE 0.9 % IV SOLN: INTRAVENOUS | Status: DC

## 2024-03-16 MED ORDER — INSULIN ASPART 100 UNIT/ML IJ SOLN
0.0000 [IU] | Freq: Three times a day (TID) | INTRAMUSCULAR | Status: DC
  Administered 2024-03-16: 7 [IU] via SUBCUTANEOUS
  Filled 2024-03-16: qty 1

## 2024-03-16 NOTE — Discharge Summary (Signed)
 Physician Discharge Summary   Patient: Rebecca Callahan MRN: [id number] DOB: Dec 25, 1995  Admit date:     03/15/2024  Discharge date: 03/16/24  Discharge Physician: Cresencio Fairly   PCP: Hersey Norleen ORN, MD   Recommendations at discharge:    F/up with outpt providers as requested  Discharge Diagnoses: Principal Problem:   Diabetic gastroparesis associated with type 2 diabetes mellitus (HCC) Active Problems:   Nausea and vomiting   Abdominal pain  Hospital Course: Assessment and Plan:  28 year old female with history of poorly controlled diabetes mellitus, recent admission for thigh abscess.  Discharged on p.o. antibiotics and oxycodone .  Presents on account of intractable nausea and vomiting.  Abdominal CT scan unremarkable.   #1.  Intractable nausea vomiting: Etiology likely due to medications versus diabetic gastroparesis.  Patient was given trial of droperidol, Phenergan and Zofran  in the ED with minimal improvement.  Likely opiate induced as she had no N/V while in the hospital without opiate. Tolerated diet.   #2.  Insulin -dependent diabetes mellitus: Last A1c was noted to be 9.8 suggesting poor control.     #3.  History of iron deficiency anemia: Continue with iron replacement.     #4.  Recent history of lower extremity cellulitis/abscess.  Status post I&D.  Patient was on antibiotics prior to admission.         Disposition: Home Diet recommendation:  Discharge Diet Orders (From admission, onward)     Start     Ordered   03/16/24 0000  Diet - low sodium heart healthy        03/16/24 1452           Carb modified diet DISCHARGE MEDICATION: Allergies as of 03/16/2024       Reactions   Amoxicillin Rash, Dermatitis   Per patient, occurred when she was a baby.        Medication List     STOP taking these medications    cephALEXin  500 MG capsule Commonly known as: KEFLEX    doxycycline  100 MG tablet Commonly known as: VIBRA -TABS   oxyCODONE  5  MG immediate release tablet Commonly known as: Oxy IR/ROXICODONE        TAKE these medications    Blood Glucose Monitoring Suppl Devi 1 each by Does not apply route in the morning, at noon, and at bedtime. May substitute to any manufacturer covered by patient's insurance.   BLOOD GLUCOSE TEST STRIPS Strp 1 each by In Vitro route in the morning, at noon, and at bedtime. May substitute to any manufacturer covered by patient's insurance.   cyanocobalamin 1000 MCG tablet Commonly known as: VITAMIN B12 Take 1 tablet (1,000 mcg total) by mouth daily for 14 days.   ferrous sulfate 325 (65 FE) MG EC tablet Take 1 tablet (325 mg total) by mouth daily with breakfast.   insulin  lispro 100 UNIT/ML KwikPen Commonly known as: HUMALOG  1-9 Units 3 (three) times daily. Sliding scale. Pt stated she takes by carb counts. Did not provide units when asked.   Lantus SoloStar 100 UNIT/ML Solostar Pen Generic drug: insulin  glargine Inject 12 Units into the skin at bedtime.   ondansetron  4 MG disintegrating tablet Commonly known as: ZOFRAN -ODT Take 1 tablet (4 mg total) by mouth every 8 (eight) hours as needed for nausea or vomiting.   TechLite Pen Needles 32G X 4 MM Misc Generic drug: Insulin  Pen Needle use with humalog  kwikpen mix 75/25 twice daily What changed: Another medication with the same name was removed. Continue taking this medication, and  follow the directions you see here.        Follow-up Information     Hersey Norleen ORN, MD. Call today.   Specialty: Family Medicine Why: To follow up Contact information: [address] [place] [address] [phone]                Discharge Exam: There were no vitals filed for this visit. General: patient is a young female, who was seen laying in bed.  Mild discomfort due to nausea. HEENT: Oral mucosa dry.  Pupils equal round reactive light accommodation Neck: Supple with no JVD Chest: Clinically clear with no added  sounds. Abdomen: Soft flat nontender.  Bowel sounds present Extremities: Left thigh with dressing in place. CNS shows no focal deficit.  Condition at discharge: good  The results of significant diagnostics from this hospitalization (including imaging, microbiology, ancillary and laboratory) are listed below for reference.   Imaging Studies: CT ABDOMEN PELVIS W CONTRAST Result Date: 03/15/2024 EXAM: CT ABDOMEN AND PELVIS WITH CONTRAST 03/15/2024 07:28:57 PM TECHNIQUE: CT of the abdomen and pelvis was performed with the administration of 100 mL of iohexol  (OMNIPAQUE ) 300 MG/ML solution. Multiplanar reformatted images are provided for review. Automated exposure control, iterative reconstruction, and/or weight-based adjustment of the mA/kV was utilized to reduce the radiation dose to as low as reasonably achievable. COMPARISON: CT pelvis dated 03/05/2024 and CT abdomen/pelvis dated 12/09/2023. CLINICAL HISTORY: Abdominal pain, acute, nonlocalized. Pt to ed from home via POV for emesis since last night. Pt was DC from here on Sunday this week. Pt was placed on PO antibiotics and has been vomiting. Pt is caox4, in no acute distress and ambulatory to triage. FINDINGS: LOWER CHEST: Small hiatal hernia. LIVER: The liver is unremarkable. GALLBLADDER AND BILE DUCTS: Gallbladder is unremarkable. No biliary ductal dilatation. SPLEEN: No acute abnormality. PANCREAS: No acute abnormality. ADRENAL GLANDS: No acute abnormality. KIDNEYS, URETERS AND BLADDER: No stones in the kidneys or ureters. No hydronephrosis. No perinephric or periureteral stranding. Mild bladder wall thickening with perivesical stranding, suggesting cystitis. GI AND BOWEL: Stomach demonstrates no acute abnormality. Normal appendix (image 48). There is no bowel obstruction. PERITONEUM AND RETROPERITONEUM: Trace pelvic free fluid. No free air. VASCULATURE: Aorta is normal in caliber. LYMPH NODES: No lymphadenopathy. REPRODUCTIVE ORGANS: No acute  abnormality. BONES AND SOFT TISSUES: No acute osseous abnormality. No focal soft tissue abnormality. IMPRESSION: 1. Suspected cystitis. 2. Otherwise negative. Electronically signed by: Pinkie Pebbles MD 03/15/2024 07:48 PM EDT RP Workstation: [id number]   CT PELVIS W CONTRAST Result Date: 03/05/2024 EXAM: CT Pelvis, With IV Contrast 03/05/2024 10:12:56 AM TECHNIQUE: Axial images were acquired through the pelvis with IV contrast. 100mL iohexol  (OMNIPAQUE ) 300 MG/ML solution was administered intravenously. Reformatted images were reviewed. Automated exposure control, iterative reconstruction, and/or weight based adjustment of the mA/kV was utilized to reduce the radiation dose to as low as reasonably achievable. COMPARISON: CT of the abdomen and pelvis dated 12/09/2023. CLINICAL HISTORY: abscess, cellulitis left upper leg and pubis. Abscess abscess, cellulitis left upper leg and pubis. Abscess FINDINGS: BONES: No acute fracture or focal osseous lesion. JOINTS: No dislocation. The joint spaces are normal. SOFT TISSUES: Mild thickening of the skin present anteromedially within the proximal thigh with stranding of the underlying subcutaneous fat. There is no appreciable fluid collection. There is no appreciable involvement of the mons pubis or of the adductor musculature of the left hip. There are several shotty inguinal lymph nodes present bilaterally, more pronounced on the left, which have increased  in size since the previous study and measure up to approximately 18 mm in long axis. INTRAPELVIC CONTENTS: The pelvic viscera are unremarkable. IMPRESSION: 1. Mild thickening of the skin anteromedially within the proximal thigh with stranding of the underlying subcutaneous fat, without appreciable fluid collection. Findings are compatible with cellulitis. 2. Several shotty inguinal lymph nodes bilaterally, more pronounced on the left, which have increased in size since the previous study and measure up to  approximately 18 mm in long axis. Electronically signed by: Evalene Coho MD 03/05/2024 10:44 AM EDT RP Workstation: [id number]   US  LT LOWER EXTREM LTD SOFT TISSUE NON VASCULAR Result Date: 03/05/2024 CLINICAL DATA:  Cellulitis EXAM: ULTRASOUND LEFT LOWER EXTREMITY LIMITED TECHNIQUE: Ultrasound examination of the lower extremity soft tissues was performed in the area of clinical concern. COMPARISON:  None Available. FINDINGS: Focused ultrasound exam was performed in the region of patient concern over the groin area. This reveals subcutaneous edema and a small complex fluid collection measuring 9 x 9 x 8 mm. Color Doppler assessment shows hyperemia in the soft tissues adjacent to the collection. IMPRESSION: 9 x 9 x 8 mm complex fluid collection in the region of patient concern. Imaging features are compatible with a small abscess on a background of cellulitis. Close follow-up recommended and follow-up CT or MRI with contrast could be used to more definitively characterize as clinically warranted. Electronically Signed   By: Camellia Candle M.D.   On: 03/05/2024 09:16    Microbiology: Results for orders placed or performed during the hospital encounter of 03/05/24  Blood culture (routine x 2)     Status: None   Collection Time: 03/05/24 12:21 PM   Specimen: BLOOD  Result Value Ref Range Status   Specimen Description BLOOD LEFT ANTECUBITAL  Final   Special Requests   Final    BOTTLES DRAWN AEROBIC AND ANAEROBIC Blood Culture results may not be optimal due to an inadequate volume of blood received in culture bottles   Culture   Final    NO GROWTH 5 DAYS Performed at [facility], [address] [place], [place] [address]    Report Status 03/10/2024 FINAL  Final  Blood culture (routine x 2)     Status: None   Collection Time: 03/05/24 12:21 PM   Specimen: BLOOD  Result Value Ref Range Status   Specimen Description BLOOD RIGHT ANTECUBITAL  Final   Special Requests   Final    BOTTLES  DRAWN AEROBIC AND ANAEROBIC Blood Culture results may not be optimal due to an inadequate volume of blood received in culture bottles   Culture   Final    NO GROWTH 5 DAYS Performed at [facility], [address], [place], [place] [address]    Report Status 03/10/2024 FINAL  Final  Aerobic Culture w Gram Stain (superficial specimen)     Status: None   Collection Time: 03/07/24  9:27 AM   Specimen: Thigh; Abscess  Result Value Ref Range Status   Specimen Description   Final    ABSCESS Performed at [facility], [address] [place], [place] [address]    Special Requests   Final     LEFT THIGH Performed at [facility], [address] [place], [place] [address]    Gram Stain   Final    FEW WBC PRESENT, PREDOMINANTLY PMN RARE GRAM POSITIVE COCCI RARE GRAM NEGATIVE RODS    Culture   Final    NO GROWTH 2 DAYS Performed at [facility], [address], [place], [place] [address]    Report Status 03/09/2024 FINAL  Final  MRSA Next Gen by PCR, Nasal     Status: None   Collection Time: 03/07/24  7:24 PM   Specimen: Nasal Mucosa; Nasal Swab  Result Value Ref Range Status   MRSA by PCR Next Gen NOT DETECTED NOT DETECTED Final    Comment: (NOTE) The GeneXpert MRSA Assay (FDA approved for NASAL specimens only), is one component of a comprehensive MRSA colonization surveillance program. It is not intended to diagnose MRSA infection nor to guide or monitor treatment for MRSA infections. Test performance is not FDA approved in patients less than 63 years old. Performed at [facility], [address] [place], [place] [address]     Labs: CBC: Recent Labs  Lab 03/15/24 1530 03/16/24 0648  WBC 6.2 7.4  HGB 9.8* 8.2*  HCT 33.3* 27.8*  MCV 78.9* 77.2*  PLT 410* 417*   Basic Metabolic Panel: Recent Labs  Lab 03/15/24 1530 03/16/24 0648  NA 137 139  K 4.2 3.6  CL 101 104  CO2 25 24  GLUCOSE 331* 261*  BUN 10 11   CREATININE 0.47 0.59  CALCIUM 9.0 8.5*  MG 2.0  --   PHOS 3.1  --    Liver Function Tests: Recent Labs  Lab 03/15/24 1530  AST 30  ALT 19  ALKPHOS 79  BILITOT 0.7  PROT 8.0  ALBUMIN 3.9   CBG: Recent Labs  Lab 03/15/24 2103 03/15/24 2305 03/16/24 0455 03/16/24 0750 03/16/24 1205  GLUCAP 294* 330* 282* 257* 178*    Discharge time spent: greater than 30 minutes.  Signed: Cresencio Fairly, MD [facility] 03/16/2024

## 2024-03-16 NOTE — Plan of Care (Signed)

## 2024-03-21 ENCOUNTER — Emergency Department
Admission: EM | Admit: 2024-03-21 | Discharge: 2024-03-21 | Disposition: A | Discharge status: Home / Self Care | Attending: Emergency Medicine | Admitting: Emergency Medicine

## 2024-03-21 ENCOUNTER — Other Ambulatory Visit: Payer: Self-pay

## 2024-03-21 DIAGNOSIS — E1143 Type 2 diabetes mellitus with diabetic autonomic (poly)neuropathy: Secondary | ICD-10-CM | POA: Insufficient documentation

## 2024-03-21 DIAGNOSIS — R112 Nausea with vomiting, unspecified: Secondary | ICD-10-CM

## 2024-03-21 DIAGNOSIS — E1165 Type 2 diabetes mellitus with hyperglycemia: Secondary | ICD-10-CM | POA: Diagnosis not present

## 2024-03-21 LAB — URINALYSIS, ROUTINE W REFLEX MICROSCOPIC
Bacteria, UA: NONE SEEN
Bilirubin Urine: NEGATIVE
Glucose, UA: >=500 mg/dL — AB
Hgb urine dipstick: NEGATIVE
Ketones, ur: NEGATIVE mg/dL
Leukocytes,Ua: NEGATIVE
Nitrite: NEGATIVE
Protein, ur: NEGATIVE mg/dL
Specific Gravity, Urine: 1.027 (ref 1.005–1.030)
pH: 6 (ref 5.0–8.0)

## 2024-03-21 LAB — CBC
HCT: 31.8 % — ABNORMAL LOW (ref 36.0–46.0)
Hemoglobin: 9.2 g/dL — ABNORMAL LOW (ref 12.0–15.0)
MCH: 23 pg — ABNORMAL LOW (ref 26.0–34.0)
MCHC: 28.9 g/dL — ABNORMAL LOW (ref 30.0–36.0)
MCV: 79.5 fL — ABNORMAL LOW (ref 80.0–100.0)
Platelets: 383 K/uL (ref 150–400)
RBC: 4 MIL/uL (ref 3.87–5.11)
RDW: 20.9 % — ABNORMAL HIGH (ref 11.5–15.5)
WBC: 3.8 K/uL — ABNORMAL LOW (ref 4.0–10.5)
nRBC: 0 % (ref 0.0–0.2)

## 2024-03-21 LAB — BASIC METABOLIC PANEL WITH GFR
Anion gap: 8 (ref 5–15)
BUN: 15 mg/dL (ref 6–20)
CO2: 24 mmol/L (ref 22–32)
Calcium: 8.6 mg/dL — ABNORMAL LOW (ref 8.9–10.3)
Chloride: 105 mmol/L (ref 98–111)
Creatinine, Ser: 0.61 mg/dL (ref 0.44–1.00)
GFR, Estimated: >60 mL/min (ref >60)
Glucose, Bld: 349 mg/dL — ABNORMAL HIGH (ref 70–99)
Potassium: 4 mmol/L (ref 3.5–5.1)
Sodium: 137 mmol/L (ref 135–145)

## 2024-03-21 LAB — CBG MONITORING, ED
Glucose-Capillary: 351 mg/dL — ABNORMAL HIGH (ref 70–99)
Glucose-Capillary: 326 mg/dL — ABNORMAL HIGH (ref 70–99)

## 2024-03-21 MED ORDER — SODIUM CHLORIDE 0.9 % IV BOLUS
1000.0000 mL | Freq: Once | INTRAVENOUS | Status: AC
  Administered 2024-03-21: 1000 mL via INTRAVENOUS

## 2024-03-21 MED ORDER — DROPERIDOL 2.5 MG/ML IJ SOLN
2.5000 mg | Freq: Once | INTRAMUSCULAR | Status: AC
  Administered 2024-03-21: 2.5 mg via INTRAVENOUS
  Filled 2024-03-21: qty 2

## 2024-03-21 MED ORDER — INSULIN ASPART 100 UNIT/ML IJ SOLN
8.0000 [IU] | Freq: Once | INTRAMUSCULAR | Status: AC
  Administered 2024-03-21: 8 [IU] via INTRAVENOUS
  Filled 2024-03-21: qty 8

## 2024-03-21 MED ORDER — METOCLOPRAMIDE HCL 10 MG PO TABS: 10.0000 mg | ORAL_TABLET | Freq: Three times a day (TID) | ORAL | 0 refills | Status: AC | PRN

## 2024-03-21 MED ORDER — METOCLOPRAMIDE HCL 5 MG/ML IJ SOLN
10.0000 mg | Freq: Once | INTRAMUSCULAR | Status: AC
  Administered 2024-03-21: 10 mg via INTRAVENOUS
  Filled 2024-03-21: qty 2

## 2024-03-21 MED ORDER — LORAZEPAM 2 MG/ML IJ SOLN
1.0000 mg | Freq: Once | INTRAMUSCULAR | Status: AC
  Administered 2024-03-21: 1 mg via INTRAVENOUS
  Filled 2024-03-21: qty 1

## 2024-03-21 MED ORDER — ONDANSETRON HCL 4 MG/2ML IJ SOLN
4.0000 mg | Freq: Once | INTRAMUSCULAR | Status: AC | PRN
  Administered 2024-03-21: 4 mg via INTRAVENOUS
  Filled 2024-03-21: qty 2

## 2024-03-21 NOTE — ED Notes (Signed)
 Pt continues to vomit p IV antiemetic adminstration

## 2024-03-21 NOTE — ED Provider Notes (Signed)
 [facility] Provider Note    Event Date/Time   First MD Initiated Contact with Patient 03/21/24 [phone]     (approximate)  History   Chief Complaint: Emesis, Abdominal Pain, and Hyperglycemia  HPI  Rebecca Callahan is a 28 y.o. female with a past medical history of diabetes, presents to the emergency department for nausea and vomiting.  Patient has a history of diabetic gastroparesis, multiple ED visits and was just discharged from the hospital 5 days ago after an admission for the same.  Patient states she still felt nauseated at home and the medications at home were not working so she came to the emergency department.  Patient denies any diarrhea.  No fever.  There are states generalized nausea and vomiting.  Patient was placed on antibiotics for a groin abscess which she states is resolving.  Physical Exam   Triage Vital Signs: ED Triage Vitals [03/21/24 0355]  Encounter Vitals Group     BP (!) 141/101     Girls Systolic BP Percentile      Girls Diastolic BP Percentile      Boys Systolic BP Percentile      Boys Diastolic BP Percentile      Pulse Rate 64     Resp 18     Temp 98.2 F (36.8 C)     Temp Source Oral     SpO2 100 %     Weight      Height      Head Circumference      Peak Flow      Pain Score 7     Pain Loc      Pain Education      Exclude from Growth Chart     Most recent vital signs: Vitals:   03/21/24 0355  BP: (!) 141/101  Pulse: 64  Resp: 18  Temp: 98.2 F (36.8 C)  SpO2: 100%    General: Somnolent but awakens to voice, answers questions before falling back asleep. CV:  Good peripheral perfusion.  Regular rate and rhythm  Resp:  Normal effort.  Equal breath sounds bilaterally.  Abd:  No distention.  Soft, nontender.  No rebound or guarding.   ED Results / Procedures / Treatments   MEDICATIONS ORDERED IN ED: Medications  insulin  aspart (novoLOG ) injection 8 Units (has no administration in time range)   ondansetron  (ZOFRAN ) injection 4 mg (4 mg Intravenous Given 03/21/24 0408)  metoCLOPramide (REGLAN) injection 10 mg (10 mg Intravenous Given 03/21/24 0526)  sodium chloride  0.9 % bolus 1,000 mL (1,000 mLs Intravenous New Bag/Given 03/21/24 0651)  droperidol (INAPSINE) 2.5 MG/ML injection 2.5 mg (2.5 mg Intravenous Given 03/21/24 9347)     IMPRESSION / MDM / ASSESSMENT AND PLAN / ED COURSE  I reviewed the triage vital signs and the nursing notes.  Patient's presentation is most consistent with acute presentation with potential threat to life or bodily function.  Patient presents to the emergency department for nausea vomiting which has been ongoing since her discharge from the hospital 5 days ago per patient.  Patient's lab work today shows reassuring CBC with white blood cell count of 3.8.  Chemistry shows hyperglycemia with a blood glucose of 349 but otherwise reassuring with normal renal function and a normal anion gap.  Patient's urinalysis is negative including negative for ketones.  We will dose IV fluids, patient received droperidol and Reglan prior to my arrival we will continue to closely monitor.  Will also dose 8 units  of IV insulin  in addition to the fluids.  Patient agreeable to plan.  Patient's workup shows a reassuring CBC reassuring chemistry with no anion gap, normal urinalysis.  Patient continues to state nausea but has not vomited since arriving to the emergency department.  She has been able to drink several sips of water.  We will attempt to discharge home with a prescription for Reglan.  Blood sugar remains elevated to around 300 however again no ketones in the urine normal anion gap.  Discussed return precautions with the patient.  FINAL CLINICAL IMPRESSION(S) / ED DIAGNOSES   Nausea vomiting    Note:  This document was prepared using Dragon voice recognition software and may include unintentional dictation errors.   Dorothyann Drivers, MD 03/21/24 1141

## 2024-03-21 NOTE — Inpatient Diabetes Management (Signed)
 Inpatient Diabetes Program Recommendations  AACE/ADA: New Consensus Statement on Inpatient Glycemic Control   Target Ranges:  Prepandial:   less than 140 mg/dL      Peak postprandial:   less than 180 mg/dL (1-2 hours)      Critically ill patients:  140 - 180 mg/dL   Lab Results  Component Value Date   GLUCAP 326 (H) 03/21/2024   HGBA1C 9.8 (H) 03/05/2024    Latest Reference Range & Units 03/21/24 03:54 03/21/24 10:26  Glucose-Capillary 70 - 99 mg/dL 648 (H) 673 (H)  (H): Data is abnormally high   Diabetes history: DM1 Outpatient Diabetes medications: Lantus 15 units nightly Novolog  1-9 units meal coverage per carb counting Current orders for Inpatient glycemic control: Novolog  8 units given x one order @ 0800  Inpatient Diabetes Program Recommendations:   DM coordinator spoke with patient on 03/07/24 on prior admission Patient has type 1 diabetes. Patient's CBG currently 326. No basal insulin  yet.  Please consider: -Lantus/Semglee 15 units now and daily -Novolog  0-9 units q 4 hrs. Correction -Novolog  3 units tid meal coverage if eats 50% meal  Thank you, Alixandra Alfieri E. Channelle Bottger, RN, MSN, CNS, CDCES  Diabetes Coordinator Inpatient Glycemic Control Team Team Pager [phone] (8am-5pm) 03/21/2024 10:43 AM

## 2024-03-21 NOTE — ED Triage Notes (Signed)
 Pt arrives ambulatory to triage, gait steady, actively vomiting. C/o n/v and abd since hospitalization on 10/25. Pt is diabetic and on antibiotics for abscess, was admitted for n/v and abd pain, d/c home w/ zofran  but not helping.  Cbg is 351 in triage.

## 2024-03-21 NOTE — Discharge Instructions (Addendum)
 Please use your Reglan as prescribed.  Return to the emergency department if you are unable to keep down fluids, or for any other symptom concerning to yourself.

## 2024-03-22 ENCOUNTER — Other Ambulatory Visit: Payer: Self-pay

## 2024-03-22 ENCOUNTER — Inpatient Hospital Stay
Admission: EM | Admit: 2024-03-22 | Discharge: 2024-03-24 | DRG: 074 | Disposition: A | Discharge status: Home / Self Care | Attending: Internal Medicine | Admitting: Internal Medicine

## 2024-03-22 DIAGNOSIS — E1065 Type 1 diabetes mellitus with hyperglycemia: Secondary | ICD-10-CM | POA: Diagnosis present

## 2024-03-22 DIAGNOSIS — Z880 Allergy status to penicillin: Secondary | ICD-10-CM

## 2024-03-22 DIAGNOSIS — Z8249 Family history of ischemic heart disease and other diseases of the circulatory system: Secondary | ICD-10-CM

## 2024-03-22 DIAGNOSIS — D509 Iron deficiency anemia, unspecified: Secondary | ICD-10-CM | POA: Diagnosis not present

## 2024-03-22 DIAGNOSIS — R1116 Cannabis hyperemesis syndrome: Secondary | ICD-10-CM | POA: Diagnosis present

## 2024-03-22 DIAGNOSIS — R1030 Lower abdominal pain, unspecified: Secondary | ICD-10-CM | POA: Diagnosis present

## 2024-03-22 DIAGNOSIS — N920 Excessive and frequent menstruation with regular cycle: Secondary | ICD-10-CM | POA: Diagnosis present

## 2024-03-22 DIAGNOSIS — K3184 Gastroparesis: Secondary | ICD-10-CM | POA: Diagnosis not present

## 2024-03-22 DIAGNOSIS — N3090 Cystitis, unspecified without hematuria: Secondary | ICD-10-CM | POA: Diagnosis present

## 2024-03-22 DIAGNOSIS — E1043 Type 1 diabetes mellitus with diabetic autonomic (poly)neuropathy: Principal | ICD-10-CM | POA: Diagnosis present

## 2024-03-22 DIAGNOSIS — R112 Nausea with vomiting, unspecified: Secondary | ICD-10-CM | POA: Diagnosis not present

## 2024-03-22 DIAGNOSIS — I493 Ventricular premature depolarization: Secondary | ICD-10-CM | POA: Diagnosis present

## 2024-03-22 DIAGNOSIS — Z794 Long term (current) use of insulin: Secondary | ICD-10-CM

## 2024-03-22 LAB — URINALYSIS, ROUTINE W REFLEX MICROSCOPIC
Bacteria, UA: NONE SEEN
Bilirubin Urine: NEGATIVE
Glucose, UA: >=500 mg/dL — AB
Hgb urine dipstick: NEGATIVE
Ketones, ur: 20 mg/dL — AB
Leukocytes,Ua: NEGATIVE
Nitrite: NEGATIVE
Protein, ur: 30 mg/dL — AB
Specific Gravity, Urine: 1.026 (ref 1.005–1.030)
pH: 7 (ref 5.0–8.0)

## 2024-03-22 LAB — CBC
HCT: 33.2 % — ABNORMAL LOW (ref 36.0–46.0)
Hemoglobin: 9.6 g/dL — ABNORMAL LOW (ref 12.0–15.0)
MCH: 22.9 pg — ABNORMAL LOW (ref 26.0–34.0)
MCHC: 28.9 g/dL — ABNORMAL LOW (ref 30.0–36.0)
MCV: 79.2 fL — ABNORMAL LOW (ref 80.0–100.0)
Platelets: 396 K/uL (ref 150–400)
RBC: 4.19 MIL/uL (ref 3.87–5.11)
RDW: 21 % — ABNORMAL HIGH (ref 11.5–15.5)
WBC: 6 K/uL (ref 4.0–10.5)
nRBC: 0 % (ref 0.0–0.2)

## 2024-03-22 LAB — COMPREHENSIVE METABOLIC PANEL WITH GFR
ALT: 11 U/L (ref 0–44)
AST: 20 U/L (ref 15–41)
Albumin: 4.1 g/dL (ref 3.5–5.0)
Alkaline Phosphatase: 51 U/L (ref 38–126)
Anion gap: 9 (ref 5–15)
BUN: 12 mg/dL (ref 6–20)
CO2: 26 mmol/L (ref 22–32)
Calcium: 9 mg/dL (ref 8.9–10.3)
Chloride: 102 mmol/L (ref 98–111)
Creatinine, Ser: 0.49 mg/dL (ref 0.44–1.00)
GFR, Estimated: >60 mL/min (ref >60)
Glucose, Bld: 218 mg/dL — ABNORMAL HIGH (ref 70–99)
Potassium: 3.6 mmol/L (ref 3.5–5.1)
Sodium: 137 mmol/L (ref 135–145)
Total Bilirubin: 1.6 mg/dL — ABNORMAL HIGH (ref 0.0–1.2)
Total Protein: 7.6 g/dL (ref 6.5–8.1)

## 2024-03-22 LAB — GLUCOSE, CAPILLARY: Glucose-Capillary: 278 mg/dL — ABNORMAL HIGH (ref 70–99)

## 2024-03-22 LAB — MAGNESIUM: Magnesium: 2.1 mg/dL (ref 1.7–2.4)

## 2024-03-22 LAB — POC URINE PREG, ED: Preg Test, Ur: NEGATIVE

## 2024-03-22 LAB — CBG MONITORING, ED: Glucose-Capillary: 182 mg/dL — ABNORMAL HIGH (ref 70–99)

## 2024-03-22 LAB — HCG, QUANTITATIVE, PREGNANCY: hCG, Beta Chain, Quant, S: 1 m[IU]/mL

## 2024-03-22 LAB — LIPASE, BLOOD: Lipase: 20 U/L (ref 11–51)

## 2024-03-22 MED ORDER — OXYCODONE-ACETAMINOPHEN 5-325 MG PO TABS: 1.0000 | ORAL_TABLET | ORAL | Status: DC | PRN

## 2024-03-22 MED ORDER — MORPHINE SULFATE (PF) 2 MG/ML IV SOLN
2.0000 mg | INTRAVENOUS | Status: DC | PRN
  Administered 2024-03-23 – 2024-03-24 (×3): 2 mg via INTRAVENOUS
  Filled 2024-03-22 (×4): qty 1

## 2024-03-22 MED ORDER — ONDANSETRON HCL 4 MG/2ML IJ SOLN: 4.0000 mg | Freq: Three times a day (TID) | INTRAMUSCULAR | Status: DC | PRN

## 2024-03-22 MED ORDER — FERROUS SULFATE 325 (65 FE) MG PO TABS
325.0000 mg | ORAL_TABLET | Freq: Every day | ORAL | Status: DC
Start: 2024-03-23 — End: 2024-03-24
  Filled 2024-03-22 (×2): qty 1

## 2024-03-22 MED ORDER — METOCLOPRAMIDE HCL 5 MG/ML IJ SOLN
5.0000 mg | Freq: Three times a day (TID) | INTRAMUSCULAR | Status: DC
  Administered 2024-03-22 – 2024-03-24 (×6): 5 mg via INTRAVENOUS
  Filled 2024-03-22 (×6): qty 2

## 2024-03-22 MED ORDER — DROPERIDOL 2.5 MG/ML IJ SOLN
2.5000 mg | Freq: Once | INTRAMUSCULAR | Status: AC
  Administered 2024-03-22: 2.5 mg via INTRAVENOUS
  Filled 2024-03-22: qty 2

## 2024-03-22 MED ORDER — SODIUM CHLORIDE 0.9 % IV SOLN
12.5000 mg | Freq: Once | INTRAVENOUS | Status: AC
  Administered 2024-03-22: 12.5 mg via INTRAVENOUS
  Filled 2024-03-22: qty 12.5

## 2024-03-22 MED ORDER — INSULIN ASPART 100 UNIT/ML IJ SOLN
0.0000 [IU] | Freq: Every day | INTRAMUSCULAR | Status: DC
  Administered 2024-03-22: 2 [IU] via SUBCUTANEOUS
  Filled 2024-03-22: qty 1

## 2024-03-22 MED ORDER — ENOXAPARIN SODIUM 40 MG/0.4ML IJ SOSY
40.0000 mg | PREFILLED_SYRINGE | INTRAMUSCULAR | Status: DC
  Administered 2024-03-22 – 2024-03-23 (×2): 40 mg via SUBCUTANEOUS
  Filled 2024-03-22 (×2): qty 0.4

## 2024-03-22 MED ORDER — LACTATED RINGERS IV SOLN: INTRAVENOUS | Status: AC

## 2024-03-22 MED ORDER — INSULIN GLARGINE-YFGN 100 UNIT/ML ~~LOC~~ SOLN
10.0000 [IU] | Freq: Every day | SUBCUTANEOUS | Status: DC
Start: 2024-03-22 — End: 2024-03-23
  Administered 2024-03-22: 10 [IU] via SUBCUTANEOUS
  Filled 2024-03-22 (×2): qty 0.1

## 2024-03-22 MED ORDER — ACETAMINOPHEN 325 MG PO TABS: 650.0000 mg | ORAL_TABLET | Freq: Four times a day (QID) | ORAL | Status: DC | PRN

## 2024-03-22 MED ORDER — INSULIN ASPART 100 UNIT/ML IJ SOLN
0.0000 [IU] | Freq: Three times a day (TID) | INTRAMUSCULAR | Status: DC
  Administered 2024-03-23 (×2): 3 [IU] via SUBCUTANEOUS
  Administered 2024-03-24: 2 [IU] via SUBCUTANEOUS
  Administered 2024-03-24: 1 [IU] via SUBCUTANEOUS
  Filled 2024-03-22 (×4): qty 1

## 2024-03-22 MED ORDER — LACTATED RINGERS IV BOLUS
1000.0000 mL | Freq: Once | INTRAVENOUS | Status: AC
  Administered 2024-03-22: 1000 mL via INTRAVENOUS

## 2024-03-22 NOTE — ED Notes (Signed)
 Pt declined to provide a urine at this time upon urging. Pt requesting medication before she will try.

## 2024-03-22 NOTE — H&P (Signed)
 History and Physical    Rebecca Callahan FMW:[id number] DOB: 1995-10-21 DOA: 03/22/2024  Referring MD/NP/PA:   PCP: Hersey Norleen ORN, MD   Patient coming from:  The patient is coming from home.     Chief Complaint: Intractable nausea, vomiting, abdominal pain  HPI: Rebecca Callahan is a 28 y.o. female with medical history significant of type 1 diabetes, gastroparesis, marijuana use, anemia, who presents with intractable nausea, vomiting and abdominal pain.  Patient was recently hospitalized from 10/25 - 10/26 due to intractable nausea vomiting which was likely due to gastroparesis and marijuana use.  Patient states in the past several days, she has worsening nausea vomiting which are intractable, cannot keep anything down.  She has lower abdominal pain, which is constant, aching, moderate, nonradiating, not aggravated or alleviated by any known factors. No diarrhea, fever or chills.  No symptoms of UTI.  No chest pain, cough, SOB. Patient was seen in ED yesterday.  She was treated with IV fluid, droperidol, Reglan with improvement, and discharged home on Reglan.  She states that her symptoms has worsened today.  CT abdomen/pelvis on 03/15/2024: 1. Suspected cystitis. 2. Otherwise negative.   Data reviewed independently and ED Course: pt was found to have WBC 6.0, GFR> 60, negative UA, negative pregnancy test, temperature normal, blood pressure 129/85, heart rate 79, RR 18.  Patient is placed in telemetry bed for observation.   EKG: I have personally reviewed.  Sinus rhythm, QTc 478, with PAC, several PVC  Review of Systems:   General: no fevers, chills, no body weight gain, has poor appetite, has fatigue HEENT: no blurry vision, hearing changes or sore throat Respiratory: no dyspnea, coughing, wheezing CV: no chest pain, no palpitations GI: has nausea, vomiting, abdominal pain, no diarrhea, constipation GU: no dysuria, burning on urination, increased urinary  frequency, hematuria  Ext: no leg edema Neuro: no unilateral weakness, numbness, or tingling, no vision change or hearing loss Skin: no rash, no skin tear. MSK: No muscle spasm, no deformity, no limitation of range of movement in spin Heme: No easy bruising.  Travel history: No recent long distant travel.   Allergy:  Allergies  Allergen Reactions   Amoxicillin Rash and Dermatitis    Per patient, occurred when she was a baby.    Past Medical History:  Diagnosis Date   Diabetes mellitus without complication (HCC)     Past Surgical History:  Procedure Laterality Date   INCISION AND DRAINAGE OF WOUND Left 03/07/2024   Procedure: IRRIGATION AND DEBRIDEMENT WOUND;  Surgeon: Jordis Laneta FALCON, MD;  Location: [facility];  Service: General;  Laterality: Left;    Social History:  reports that she has never smoked. She has never used smokeless tobacco. She reports current alcohol use. She reports current drug use. Drug: Marijuana.  Family History:  Family History  Problem Relation Age of Onset   Hypertension Mother      Prior to Admission medications   Medication Sig Start Date End Date Taking? Authorizing Provider  Blood Glucose Monitoring Suppl DEVI 1 each by Does not apply route in the morning, at noon, and at bedtime. May substitute to any manufacturer covered by patient's insurance. 08/09/22   Patel, Sona, MD  cyanocobalamin (VITAMIN B12) 1000 MCG tablet Take 1 tablet (1,000 mcg total) by mouth daily for 14 days. 03/09/24 03/23/24  Laurita Pillion, MD  ferrous sulfate 325 (65 FE) MG EC tablet Take 1 tablet (325 mg total) by mouth daily with breakfast. 03/09/24 04/08/24  Laurita,  Dekui, MD  Glucose Blood (BLOOD GLUCOSE TEST STRIPS) STRP 1 each by In Vitro route in the morning, at noon, and at bedtime. May substitute to any manufacturer covered by patient's insurance. 08/09/22   Patel, Sona, MD  insulin  lispro (HUMALOG ) 100 UNIT/ML KwikPen 1-9 Units 3 (three) times daily. Sliding scale. Pt stated  she takes by carb counts. Did not provide units when asked.    [provider]  Insulin  Pen Needle 32G X 4 MM MISC use with humalog  kwikpen mix 75/25 twice daily 08/09/22   Patel, Sona, MD  LANTUS SOLOSTAR 100 UNIT/ML Solostar Pen Inject 12 Units into the skin at bedtime. 09/13/22   [provider]  metoCLOPramide (REGLAN) 10 MG tablet Take 1 tablet (10 mg total) by mouth every 8 (eight) hours as needed for nausea. 03/21/24 03/21/25  Dorothyann Drivers, MD  ondansetron  (ZOFRAN -ODT) 4 MG disintegrating tablet Take 1 tablet (4 mg total) by mouth every 8 (eight) hours as needed for nausea or vomiting. 03/15/24   Waymond Lorelle Cummins, MD    Physical Exam: Vitals:   03/22/24 1927 03/22/24 1930 03/22/24 1957 03/22/24 2019  BP:  (!) 145/94  121/74  Pulse:  67  65  Resp:    18  Temp: 98.8 F (37.1 C)   98.4 F (36.9 C)  TempSrc: Oral     SpO2:  100%  100%  Weight:   67.6 kg    General: Not in acute distress.  Dry mucous membrane. HEENT:       Eyes: PERRL, EOMI, no jaundice       ENT: No discharge from the ears and nose, no pharynx injection, no tonsillar enlargement.        Neck: No JVD, no bruit, no mass felt. Heme: No neck lymph node enlargement. Cardiac: S1/S2, RRR, No murmurs, No gallops or rubs. Respiratory: No rales, wheezing, rhonchi or rubs. GI: Soft, nondistended, has mild tenderness in lower abdomen, no rebound pain, no organomegaly, BS present. GU: No hematuria Ext: No pitting leg edema bilaterally. 1+DP/PT pulse bilaterally. Musculoskeletal: No joint deformities, No joint redness or warmth, no limitation of ROM in spin. Skin: No rashes.  Neuro: Alert, oriented X3, cranial nerves II-XII grossly intact, moves all extremities normally. Psych: Patient is not psychotic, no suicidal or hemocidal ideation.  Labs on Admission: I have personally reviewed following labs and imaging studies  CBC: Recent Labs  Lab 03/16/24 0648 03/21/24 0401 03/22/24 1443  WBC 7.4 3.8* 6.0   HGB 8.2* 9.2* 9.6*  HCT 27.8* 31.8* 33.2*  MCV 77.2* 79.5* 79.2*  PLT 417* 383 396   Basic Metabolic Panel: Recent Labs  Lab 03/16/24 0648 03/21/24 0401 03/22/24 1443  NA 139 137 137  K 3.6 4.0 3.6  CL 104 105 102  CO2 24 24 26   GLUCOSE 261* 349* 218*  BUN 11 15 12   CREATININE 0.59 0.61 0.49  CALCIUM 8.5* 8.6* 9.0  MG  --   --  2.1   GFR: Estimated Creatinine Clearance: 104.7 mL/min (by C-G formula based on SCr of 0.49 mg/dL). Liver Function Tests: Recent Labs  Lab 03/22/24 1443  AST 20  ALT 11  ALKPHOS 51  BILITOT 1.6*  PROT 7.6  ALBUMIN 4.1   Recent Labs  Lab 03/22/24 1443  LIPASE 20   No results for input(s): AMMONIA in the last 168 hours. Coagulation Profile: No results for input(s): INR, PROTIME in the last 168 hours. Cardiac Enzymes: No results for input(s): CKTOTAL, CKMB, CKMBINDEX, TROPONINI in  the last 168 hours. BNP (last 3 results) No results for input(s): PROBNP in the last 8760 hours. HbA1C: No results for input(s): HGBA1C in the last 72 hours. CBG: Recent Labs  Lab 03/16/24 1205 03/21/24 0354 03/21/24 1026 03/22/24 1441 03/22/24 2158  GLUCAP 178* 351* 326* 182* 278*   Lipid Profile: No results for input(s): CHOL, HDL, LDLCALC, TRIG, CHOLHDL, LDLDIRECT in the last 72 hours. Thyroid Function Tests: No results for input(s): TSH, T4TOTAL, FREET4, T3FREE, THYROIDAB in the last 72 hours. Anemia Panel: No results for input(s): VITAMINB12, FOLATE, FERRITIN, TIBC, IRON, RETICCTPCT in the last 72 hours. Urine analysis:    Component Value Date/Time   COLORURINE YELLOW (A) 03/22/2024 1636   APPEARANCEUR HAZY (A) 03/22/2024 1636   APPEARANCEUR Cloudy 07/26/2013 1221   LABSPEC 1.026 03/22/2024 1636   LABSPEC 1.027 07/26/2013 1221   PHURINE 7.0 03/22/2024 1636   GLUCOSEU >=500 (A) 03/22/2024 1636   GLUCOSEU Negative 07/26/2013 1221   HGBUR NEGATIVE 03/22/2024 1636   BILIRUBINUR NEGATIVE  03/22/2024 1636   BILIRUBINUR small (A) 03/01/2024 1043   BILIRUBINUR Negative 07/26/2013 1221   KETONESUR 20 (A) 03/22/2024 1636   PROTEINUR 30 (A) 03/22/2024 1636   UROBILINOGEN 0.2 03/01/2024 1043   NITRITE NEGATIVE 03/22/2024 1636   LEUKOCYTESUR NEGATIVE 03/22/2024 1636   LEUKOCYTESUR 1+ 07/26/2013 1221   Sepsis Labs: @LABRCNTIP (procalcitonin:4,lacticidven:4) )No results found for this or any previous visit (from the past 240 hours).   Radiological Exams on Admission:   Assessment/Plan Principal Problem:   Intractable nausea and vomiting Active Problems:   Gastroparesis   Cannabinoid hyperemesis syndrome   Uncontrolled type 1 diabetes mellitus with hyperglycemia, with long-term current use of insulin  (HCC)   Iron deficiency anemia   Assessment and Plan:  Principal Problem:   Intractable nausea and vomiting Active Problems:   Gastroparesis   Cannabinoid hyperemesis syndrome   Uncontrolled type 1 diabetes mellitus with hyperglycemia, with long-term current use of insulin  (HCC)   Iron deficiency anemia       DVT ppx:  SQ Lovenox   Code Status: Full code   Family Communication:     not done, no family member is at bed side.        Disposition Plan:  Anticipate discharge back to previous environment  Consults called:  none  Admission status and Level of care: Telemetry:    for obs     Dispo: The patient is from: Home              Anticipated d/c is to: Home              Anticipated d/c date is: 1 day              Patient currently is not medically stable to d/c.    Severity of Illness:  The appropriate patient status for this patient is OBSERVATION. Observation status is judged to be reasonable and necessary in order to provide the required intensity of service to ensure the patient's safety. The patient's presenting symptoms, physical exam findings, and initial radiographic and laboratory data in the context of their medical condition is felt to place  them at decreased risk for further clinical deterioration. Furthermore, it is anticipated that the patient will be medically stable for discharge from the hospital within 2 midnights of admission.        Date of Service 03/22/2024    Caleb Exon [facility]   If 7PM-7AM, please contact night-coverage [email or web address] 03/22/2024, 11:38 PM

## 2024-03-22 NOTE — ED Triage Notes (Signed)
 First nurse note: Pt to ED via [facility] from home. Pt rpeorts N/V and abd pain all day long. Pt reports pain is LLQ. Seen in October for same and low hemoglobin. Hx of DM.   CBG 218 PVC on hear monitor  170/72 100% RA

## 2024-03-22 NOTE — ED Provider Notes (Signed)
 [facility] Provider Note    Event Date/Time   First MD Initiated Contact with Patient 03/22/24 1547     (approximate)   History   Chief Complaint Emesis   HPI  Rebecca Callahan is a 28 y.o. female with past medical history of type 1 diabetes, gastroparesis, and iron deficiency anemia who presents to the ED complaining of nausea.  Patient reports that she has been feeling persistently nauseous over the past few days with multiple episodes of vomiting whenever she tries to eat anything.  She denies any associated diarrhea, has had crampy pain in both sides of her lower abdomen.  She denies any difficulty urinating and has not had any fevers or flank pain.  She also denies any cough, chest pain, or shortness of breath.     Physical Exam   Triage Vital Signs: ED Triage Vitals [03/22/24 1442]  Encounter Vitals Group     BP (!) 143/98     Girls Systolic BP Percentile      Girls Diastolic BP Percentile      Boys Systolic BP Percentile      Boys Diastolic BP Percentile      Pulse Rate 65     Resp 18     Temp 98.1 F (36.7 C)     Temp Source Oral     SpO2 100 %     Weight      Height      Head Circumference      Peak Flow      Pain Score 5     Pain Loc      Pain Education      Exclude from Growth Chart     Most recent vital signs: Vitals:   03/22/24 1925 03/22/24 1927  BP:    Pulse:    Resp:    Temp:  98.8 F (37.1 C)  SpO2: 98%     Constitutional: Alert and oriented. Eyes: Conjunctivae are normal. Head: Atraumatic. Nose: No congestion/rhinnorhea. Mouth/Throat: Mucous membranes are dry.  Cardiovascular: Normal rate, regular rhythm. Grossly normal heart sounds.  2+ radial pulses bilaterally. Respiratory: Normal respiratory effort.  No retractions. Lungs CTAB. Gastrointestinal: Soft and nontender. No distention. Musculoskeletal: No lower extremity tenderness nor edema.  Neurologic:  Normal speech and language. No gross focal  neurologic deficits are appreciated.    ED Results / Procedures / Treatments   Labs (all labs ordered are listed, but only abnormal results are displayed) Labs Reviewed  COMPREHENSIVE METABOLIC PANEL WITH GFR - Abnormal; Notable for the following components:      Result Value   Glucose, Bld 218 (*)    Total Bilirubin 1.6 (*)    All other components within normal limits  CBC - Abnormal; Notable for the following components:   Hemoglobin 9.6 (*)    HCT 33.2 (*)    MCV 79.2 (*)    MCH 22.9 (*)    MCHC 28.9 (*)    RDW 21.0 (*)    All other components within normal limits  URINALYSIS, ROUTINE W REFLEX MICROSCOPIC - Abnormal; Notable for the following components:   Color, Urine YELLOW (*)    APPearance HAZY (*)    Glucose, UA >=500 (*)    Ketones, ur 20 (*)    Protein, ur 30 (*)    All other components within normal limits  CBG MONITORING, ED - Abnormal; Notable for the following components:   Glucose-Capillary 182 (*)    All other components  within normal limits  LIPASE, BLOOD  HCG, QUANTITATIVE, PREGNANCY  MAGNESIUM   POC URINE PREG, ED     EKG  ED ECG REPORT I, Carlin Palin, the attending physician, personally viewed and interpreted this ECG.   Date: 03/22/2024  EKG Time: 4:03  Rate: 68  Rhythm: normal sinus rhythm, frequent PVC's noted  Axis: Normal  Intervals:none  ST&T Change: None PROCEDURES:  Critical Care performed: No  Procedures   MEDICATIONS ORDERED IN ED: Medications  promethazine (PHENERGAN) 12.5 mg in sodium chloride  0.9 % 50 mL IVPB (0 mg Intravenous Stopped 03/22/24 1730)  lactated ringers  bolus 1,000 mL (1,000 mLs Intravenous New Bag/Given 03/22/24 1640)  droperidol (INAPSINE) 2.5 MG/ML injection 2.5 mg (2.5 mg Intravenous Given 03/22/24 1853)     IMPRESSION / MDM / ASSESSMENT AND PLAN / ED COURSE  I reviewed the triage vital signs and the nursing notes.                              28 y.o. female with past medical history of diabetes,  gastroparesis, and iron deficiency anemia who presents to the ED complaining of persistent nausea and vomiting with inability to tolerate oral intake over the past couple of days.  Patient's presentation is most consistent with acute presentation with potential threat to life or bodily function.  Differential diagnosis includes, but is not limited to, gastritis, gastroparesis, pancreatitis, hepatitis, cholecystitis, biliary colic, DKA, dehydration, electrolyte abnormality, AKI, UTI, pregnancy.  Patient nontoxic-appearing and in no acute distress, vital signs are unremarkable.  She has a benign abdominal exam without tenderness, has had multiple presentations for nausea and vomiting in the past with CT imaging about 1 week ago remarkable only for possible cysitis.  Prior thigh abscess seems to be well-healing and labs are reassuring without significant anemia, leukocytosis, electrolyte abnormality, or AKI.  LFTs and lipase are unremarkable, pregnancy testing and urinalysis are pending.  She states that Phenergan has worked well for her in the past, will treat with IV Phenergan and hydrate with IV fluids.  Patient continues to feel nauseous despite IV Phenergan and droperidol.  Case discussed with hospitalist for admission for further management of intractable nausea and vomiting.  Patient does report regular marijuana use, with last use coming last night.  She was advised that this could be contributing to her nausea and vomiting.      FINAL CLINICAL IMPRESSION(S) / ED DIAGNOSES   Final diagnoses:  Intractable nausea and vomiting  Cannabinoid hyperemesis syndrome     Rx / DC Orders   ED Discharge Orders     None        Note:  This document was prepared using Dragon voice recognition software and may include unintentional dictation errors.   Palin Carlin, MD 03/22/24 [phone]

## 2024-03-23 DIAGNOSIS — K3184 Gastroparesis: Secondary | ICD-10-CM | POA: Diagnosis present

## 2024-03-23 DIAGNOSIS — Z880 Allergy status to penicillin: Secondary | ICD-10-CM | POA: Diagnosis not present

## 2024-03-23 DIAGNOSIS — I493 Ventricular premature depolarization: Secondary | ICD-10-CM | POA: Diagnosis present

## 2024-03-23 DIAGNOSIS — N3090 Cystitis, unspecified without hematuria: Secondary | ICD-10-CM | POA: Diagnosis present

## 2024-03-23 DIAGNOSIS — E1043 Type 1 diabetes mellitus with diabetic autonomic (poly)neuropathy: Secondary | ICD-10-CM | POA: Diagnosis present

## 2024-03-23 DIAGNOSIS — R1030 Lower abdominal pain, unspecified: Secondary | ICD-10-CM | POA: Diagnosis present

## 2024-03-23 DIAGNOSIS — R1116 Cannabis hyperemesis syndrome: Secondary | ICD-10-CM

## 2024-03-23 DIAGNOSIS — R112 Nausea with vomiting, unspecified: Secondary | ICD-10-CM | POA: Diagnosis not present

## 2024-03-23 DIAGNOSIS — Z794 Long term (current) use of insulin: Secondary | ICD-10-CM | POA: Diagnosis not present

## 2024-03-23 DIAGNOSIS — N920 Excessive and frequent menstruation with regular cycle: Secondary | ICD-10-CM | POA: Diagnosis present

## 2024-03-23 DIAGNOSIS — E1065 Type 1 diabetes mellitus with hyperglycemia: Secondary | ICD-10-CM | POA: Diagnosis present

## 2024-03-23 DIAGNOSIS — Z8249 Family history of ischemic heart disease and other diseases of the circulatory system: Secondary | ICD-10-CM | POA: Diagnosis not present

## 2024-03-23 DIAGNOSIS — D509 Iron deficiency anemia, unspecified: Secondary | ICD-10-CM | POA: Diagnosis present

## 2024-03-23 LAB — BASIC METABOLIC PANEL WITH GFR
Anion gap: 11 (ref 5–15)
BUN: 10 mg/dL (ref 6–20)
CO2: 22 mmol/L (ref 22–32)
Calcium: 8.7 mg/dL — ABNORMAL LOW (ref 8.9–10.3)
Chloride: 100 mmol/L (ref 98–111)
Creatinine, Ser: 0.62 mg/dL (ref 0.44–1.00)
GFR, Estimated: >60 mL/min (ref >60)
Glucose, Bld: 256 mg/dL — ABNORMAL HIGH (ref 70–99)
Potassium: 3.7 mmol/L (ref 3.5–5.1)
Sodium: 133 mmol/L — ABNORMAL LOW (ref 135–145)

## 2024-03-23 LAB — CBC
HCT: 32.3 % — ABNORMAL LOW (ref 36.0–46.0)
Hemoglobin: 9.7 g/dL — ABNORMAL LOW (ref 12.0–15.0)
MCH: 23.5 pg — ABNORMAL LOW (ref 26.0–34.0)
MCHC: 30 g/dL (ref 30.0–36.0)
MCV: 78.2 fL — ABNORMAL LOW (ref 80.0–100.0)
Platelets: 389 K/uL (ref 150–400)
RBC: 4.13 MIL/uL (ref 3.87–5.11)
RDW: 20.5 % — ABNORMAL HIGH (ref 11.5–15.5)
WBC: 7 K/uL (ref 4.0–10.5)
nRBC: 0 % (ref 0.0–0.2)

## 2024-03-23 LAB — GLUCOSE, CAPILLARY
Glucose-Capillary: 265 mg/dL — ABNORMAL HIGH (ref 70–99)
Glucose-Capillary: 219 mg/dL — ABNORMAL HIGH (ref 70–99)
Glucose-Capillary: 115 mg/dL — ABNORMAL HIGH (ref 70–99)
Glucose-Capillary: 148 mg/dL — ABNORMAL HIGH (ref 70–99)

## 2024-03-23 MED ORDER — SODIUM CHLORIDE 0.9 % IV SOLN
12.5000 mg | Freq: Four times a day (QID) | INTRAVENOUS | Status: DC | PRN
  Administered 2024-03-23 – 2024-03-24 (×3): 12.5 mg via INTRAVENOUS
  Filled 2024-03-23: qty 12.5
  Filled 2024-03-23: qty 0.5
  Filled 2024-03-23 (×2): qty 12.5

## 2024-03-23 MED ORDER — INSULIN GLARGINE-YFGN 100 UNIT/ML ~~LOC~~ SOLN
8.0000 [IU] | Freq: Two times a day (BID) | SUBCUTANEOUS | Status: DC
  Administered 2024-03-23 – 2024-03-24 (×3): 8 [IU] via SUBCUTANEOUS
  Filled 2024-03-23 (×4): qty 0.08

## 2024-03-23 MED ORDER — INSULIN ASPART 100 UNIT/ML IJ SOLN
3.0000 [IU] | Freq: Three times a day (TID) | INTRAMUSCULAR | Status: DC
  Administered 2024-03-23 (×2): 3 [IU] via SUBCUTANEOUS
  Filled 2024-03-23: qty 1

## 2024-03-23 NOTE — Hospital Course (Addendum)
 Taken from H&P.  Rebecca Callahan is a 28 y.o. female with medical history significant of type 1 diabetes, gastroparesis, marijuana use, anemia, who presents with intractable nausea, vomiting and abdominal pain.   Patient was recently hospitalized from 10/25 - 10/26 due to intractable nausea vomiting which was likely due to gastroparesis and marijuana use.  Patient had multiple hospitalizations and ED visits since July. She was discharged home on Reglan and came back to ED with worsening symptoms today.  On presentation vitals and labs stable.  UA negative for UTI.  11/2: Vital stable, CBG elevated, increasing Semglee to 8 units twice daily and adding 3 units with meal.  Pseudohyponatremia secondary to hyperglycemia.  Patient continued to use marijuana despite multiple counseling.  Mother was very concerned that we are not doing anything else except giving her nausea medications and wants to take her to [facility] or [facility]. We will not be able to transfer.  GI was also consulted to see if we can do a gastric emptying studies or endoscopy to complete the workup as inpatient.  Marijuana will affect the gastric emptying studies.  11/3: Hemodynamically stable but continued to have significant nausea and per patient she was throwing up at night.  Not able to take much p.o.  Mother requested discharge as she wants a second opinion at a tertiary care center with the hope that they can help her more.  She was again counseled to stop using marijuana and keep her blood glucose under good control.  Patient will get benefit from gastric emptying studies once off from marijuana and possible EGD if needed by GI for further evaluation.  Patient was also recently found to have low iron saturation and ferritin, low B12 and should continue with supplements.  She has an history of menorrhagia. She will continue her current medications and need to have a close follow-up with her providers for further assistance.

## 2024-03-23 NOTE — Progress Notes (Signed)
 Progress Note   Patient: Rebecca Callahan FMW:[id number] DOB: 02-23-1996 DOA: 03/22/2024     0 DOS: the patient was seen and examined on 03/23/2024   Brief hospital course: Taken from H&P.  Rebecca Callahan is a 28 y.o. female with medical history significant of type 1 diabetes, gastroparesis, marijuana use, anemia, who presents with intractable nausea, vomiting and abdominal pain.   Patient was recently hospitalized from 10/25 - 10/26 due to intractable nausea vomiting which was likely due to gastroparesis and marijuana use.  Patient had multiple hospitalizations and ED visits since July. She was discharged home on Reglan and came back to ED with worsening symptoms today.  On presentation vitals and labs stable.  UA negative for UTI.  11/2: Vital stable, CBG elevated, increasing Semglee to 8 units twice daily and adding 3 units with meal.  Pseudohyponatremia secondary to hyperglycemia.  Patient continued to use marijuana despite multiple counseling.  Mother was very concerned that we are not doing anything else except giving her nausea medications and wants to take her to [facility] or [facility]. We will not be able to transfer.  GI was also consulted to see if we can do a gastric emptying studies or endoscopy to complete the workup as inpatient.  Marijuana will affect the gastric emptying studies.  Assessment and Plan: * Intractable nausea and vomiting Multiple recurrent hospitalizations for similar reason. It was thought to be due to a combination of diabetes induced gastroparesis and marijuana use.  There was also concern of related to opioid on prior admission.  There was no gastric emptying studies or further GI evaluation done yet.  Family seems frustrated but patient continue to use marijuana despite multiple counseling.  - Marijuana use can affect the gastric emptying studies and patient has to be off of marijuana before doing it. - GI was consulted for further evaluation. -  Continue with supportive care with antiemetics.  Cannabinoid hyperemesis syndrome Patient continues to use marijuana, last use was either on 10/30 or 10/31 per mother. - Counseling was again provided  Gastroparesis Patient need gastric emptying studies for final diagnosis of gastroparesis, he has to be off from marijuana before that. -GI was also consulted -Should be using small frequent meals and Reglan before meal to see if that will help.  Uncontrolled type 1 diabetes mellitus with hyperglycemia, with long-term current use of insulin  (HCC) Uncontrolled diabetes with hyperglycemia and elevated A1c of 9.8. - CBG remained elevated -Increasing Semglee to 8 units twice daily -Adding 3 units with meals if eat more than 50% -Continue with SSI  Iron deficiency anemia - Continue home iron supplement   Subjective: Patient continued to have some nausea and dry heaving but no vomiting since in the hospital.  Mother at bedside was very frustrated that we are not doing anything, had a lengthy discussion explaining the concept of gastroparesis and marijuana use which she continue despite multiple counseling.  She wants her to take to [facility] or [facility], we will not be able to transfer.  Patient is hemodynamically stable and family has an option to get a second opinion.  Physical Exam: Vitals:   03/22/24 1957 03/22/24 2019 03/23/24 0506 03/23/24 0627  BP:  121/74 139/87   Pulse:  65 68   Resp:  18 18   Temp:  98.4 F (36.9 C) 98.5 F (36.9 C)   TempSrc:   Oral   SpO2:  100% 100%   Weight: 67.6 kg   67 kg  Height:  5' 8 (1.727 m)   General.  Well-developed lady, in no acute distress. Pulmonary.  Lungs clear bilaterally, normal respiratory effort. CV.  Regular rate and rhythm, no JVD, rub or murmur. Abdomen.  Soft, nontender, nondistended, BS positive. CNS.  Alert and oriented .  No focal neurologic deficit. Extremities.  No edema, no cyanosis, pulses intact and symmetrical. Psychiatry.   Judgment and insight appears normal.   Data Reviewed: Prior data reviewed  Family Communication: Discussed with mother at bedside  Disposition: Status is: Observation The patient remains OBS appropriate and will d/c before 2 midnights.  Planned Discharge Destination: Home  DVT prophylaxis.  Lovenox  Time spent: 50 minutes  This record has been created using Conservation officer, historic buildings. Errors have been sought and corrected,but may not always be located. Such creation errors do not reflect on the standard of care.   Author: Amaryllis Dare, MD 03/23/2024 12:30 PM  For on call review [email or web address].

## 2024-03-23 NOTE — Assessment & Plan Note (Signed)
 Multiple recurrent hospitalizations for similar reason. It was thought to be due to a combination of diabetes induced gastroparesis and marijuana use.  There was also concern of related to opioid on prior admission.  There was no gastric emptying studies or further GI evaluation done yet.  Family seems frustrated but patient continue to use marijuana despite multiple counseling.  - Marijuana use can affect the gastric emptying studies and patient has to be off of marijuana before doing it. - GI was consulted for further evaluation. - Continue with supportive care with antiemetics.

## 2024-03-23 NOTE — Assessment & Plan Note (Signed)
 Patient continues to use marijuana, last use was either on 10/30 or 10/31 per mother. - Counseling was again provided

## 2024-03-23 NOTE — Assessment & Plan Note (Signed)
-   Continue home iron supplement 

## 2024-03-23 NOTE — Plan of Care (Signed)
  Problem: Education: Goal: Ability to describe self-care measures that may prevent or decrease complications (Diabetes Survival Skills Education) will improve Outcome: Progressing Goal: Individualized Educational Video(s) Outcome: Progressing   Problem: Coping: Goal: Ability to adjust to condition or change in health will improve Outcome: Progressing   Problem: Fluid Volume: Goal: Ability to maintain a balanced intake and output will improve Outcome: Progressing   Problem: Health Behavior/Discharge Planning: Goal: Ability to identify and utilize available resources and services will improve Outcome: Progressing Goal: Ability to manage health-related needs will improve Outcome: Progressing   Problem: Metabolic: Goal: Ability to maintain appropriate glucose levels will improve Outcome: Progressing   Problem: Nutritional: Goal: Maintenance of adequate nutrition will improve Outcome: Progressing Goal: Progress toward achieving an optimal weight will improve Outcome: Progressing   Problem: Skin Integrity: Goal: Risk for impaired skin integrity will decrease Outcome: Progressing   Problem: Education: Goal: Knowledge of General Education information will improve Description: Including pain rating scale, medication(s)/side effects and non-pharmacologic comfort measures Outcome: Progressing   Problem: Tissue Perfusion: Goal: Adequacy of tissue perfusion will improve Outcome: Progressing   Problem: Health Behavior/Discharge Planning: Goal: Ability to manage health-related needs will improve Outcome: Progressing   Problem: Clinical Measurements: Goal: Ability to maintain clinical measurements within normal limits will improve Outcome: Progressing Goal: Will remain free from infection Outcome: Progressing Goal: Diagnostic test results will improve Outcome: Progressing Goal: Respiratory complications will improve Outcome: Progressing Goal: Cardiovascular complication will  be avoided Outcome: Progressing   Problem: Activity: Goal: Risk for activity intolerance will decrease Outcome: Progressing   Problem: Nutrition: Goal: Adequate nutrition will be maintained Outcome: Progressing   Problem: Coping: Goal: Level of anxiety will decrease Outcome: Progressing   Problem: Elimination: Goal: Will not experience complications related to bowel motility Outcome: Progressing Goal: Will not experience complications related to urinary retention Outcome: Progressing   Problem: Pain Managment: Goal: General experience of comfort will improve and/or be controlled Outcome: Progressing   Problem: Safety: Goal: Ability to remain free from injury will improve Outcome: Progressing   Problem: Skin Integrity: Goal: Risk for impaired skin integrity will decrease Outcome: Progressing

## 2024-03-23 NOTE — Consult Note (Addendum)
 Ruel Kung , MD [address], [address], [place], [place], [address] Phone: [phone] Fax: [phone]  Consultation  Referring Provider:   Dr. Caleen Primary Care Physician:  Hersey Norleen ORN, MD Primary Gastroenterologist: None         Reason for Consultation:     Nausea vomiting  Date of Admission:  03/22/2024 Date of Consultation:  03/23/2024         HPI:   Rebecca Callahan is a 28 y.o. female Presented hospital with intractable nausea vomiting and abdominal pain.  History of type 1 diabetes gastroparesis marijuana use.  Hospitalized on 03/15/2024 due to intractable nausea vomiting felt likely due to gastroparesis and marijuana use after going home got worse.  Improved with Reglan.  CT abdomen on 03/15/2024 showed suspected cystitis otherwise negative.  It has been treated as cannabinoid hyperemesis syndrome I been asked to provide my recommendations for the same.   Patient was very drowsy not able to give me a proper history but she did say that she had been using marijuana and all of this started just a few weeks back.  Continues to have nausea vomiting denies any rectal bleeding stool is brown in color.  Denies any significant abdominal pain.  Says she has heavy menstrual bleeding going on for 4 days changing multiple pads but cannot quantify the same. Past Medical History:  Diagnosis Date   Diabetes mellitus without complication (HCC)     Past Surgical History:  Procedure Laterality Date   INCISION AND DRAINAGE OF WOUND Left 03/07/2024   Procedure: IRRIGATION AND DEBRIDEMENT WOUND;  Surgeon: Jordis Laneta FALCON, MD;  Location: [facility] OR[facility];  Service: General;  Laterality: Left;    Prior to Admission medications   Medication Sig Start Date End Date Taking? Authorizing Provider  cyanocobalamin (VITAMIN B12) 1000 MCG tablet Take 1 tablet (1,000 mcg total) by mouth daily for 14 days. 03/09/24 03/23/24 Yes Laurita Pillion, MD  ferrous sulfate 325 (65 FE) MG EC tablet Take 1  tablet (325 mg total) by mouth daily with breakfast. 03/09/24 04/08/24 Yes Laurita Pillion, MD  insulin  lispro (HUMALOG ) 100 UNIT/ML KwikPen 1-9 Units 3 (three) times daily. Sliding scale. Pt stated she takes by carb counts. Did not provide units when asked.   Yes [provider]  LANTUS SOLOSTAR 100 UNIT/ML Solostar Pen Inject 12 Units into the skin at bedtime. 09/13/22  Yes [provider]  metoCLOPramide (REGLAN) 10 MG tablet Take 1 tablet (10 mg total) by mouth every 8 (eight) hours as needed for nausea. 03/21/24 03/21/25 Yes Dorothyann Drivers, MD  ondansetron  (ZOFRAN -ODT) 4 MG disintegrating tablet Take 1 tablet (4 mg total) by mouth every 8 (eight) hours as needed for nausea or vomiting. 03/15/24  Yes Waymond Lorelle Cummins, MD  Blood Glucose Monitoring Suppl DEVI 1 each by Does not apply route in the morning, at noon, and at bedtime. May substitute to any manufacturer covered by patient's insurance. 08/09/22   Patel, Sona, MD  Glucose Blood (BLOOD GLUCOSE TEST STRIPS) STRP 1 each by In Vitro route in the morning, at noon, and at bedtime. May substitute to any manufacturer covered by patient's insurance. 08/09/22   Patel, Sona, MD  Insulin  Pen Needle 32G X 4 MM MISC use with humalog  kwikpen mix 75/25 twice daily 08/09/22   Patel, Sona, MD    Family History  Problem Relation Age of Onset   Hypertension Mother      Social History   Tobacco Use   Smoking  status: Never   Smokeless tobacco: Never  Substance Use Topics   Alcohol use: Yes   Drug use: Yes    Types: Marijuana    Allergies as of 03/22/2024 - Review Complete 03/22/2024  Allergen Reaction Noted   Amoxicillin Rash and Dermatitis 09/29/2017    Review of Systems:    All systems reviewed and negative except where noted in HPI.   Physical Exam:  Vital signs in last 24 hours: Temp:  [98.1 F (36.7 C)-98.8 F (37.1 C)] 98.5 F (36.9 C) (11/02 0506) Pulse Rate:  [65-79] 68 (11/02 0506) Resp:  [18] 18 (11/02 0506) BP:  (121-145)/(74-98) 139/87 (11/02 0506) SpO2:  [97 %-100 %] 100 % (11/02 0506) Weight:  [67 kg-67.6 kg] 67 kg (11/02 0627)   General:   Awake but drowsy falling off to sleep Head:  Normocephalic and atraumatic. Eyes:   No icterus.   Conjunctiva pink. PERRLA. Ears:  Normal auditory acuity. Neck:  Supple; no masses or thyroidomegaly Lungs: Respirations even and unlabored. Lungs clear to auscultation bilaterally.   No wheezes, crackles, or rhonchi.  Heart:  Regular rate and rhythm;  Without murmur, clicks, rubs or gallops Abdomen:  Soft, nondistended, nontender. Normal bowel sounds. No appreciable masses or hepatomegaly.  No rebound or guarding.  Neurologic:  Alert and oriented x3;  grossly normal neurologically. Psych:  Alert and cooperative. Normal affect.  LAB RESULTS: Recent Labs    03/21/24 0401 03/22/24 1443 03/23/24 0512  WBC 3.8* 6.0 7.0  HGB 9.2* 9.6* 9.7*  HCT 31.8* 33.2* 32.3*  PLT 383 396 389   BMET Recent Labs    03/21/24 0401 03/22/24 1443 03/23/24 0512  NA 137 137 133*  K 4.0 3.6 3.7  CL 105 102 100  CO2 24 26 22   GLUCOSE 349* 218* 256*  BUN 15 12 10   CREATININE 0.61 0.49 0.62  CALCIUM 8.6* 9.0 8.7*   LFT Recent Labs    03/22/24 1443  PROT 7.6  ALBUMIN 4.1  AST 20  ALT 11  ALKPHOS 51  BILITOT 1.6*   PT/INR No results for input(s): LABPROT, INR in the last 72 hours.  STUDIES: No results found.    Impression / Plan:   [name] is a 28 y.o. y/o female with poorly controlled type 1 diabetes mellitus HbA1c 9.8, use of marijuana, recurrent admission for intractable nausea vomiting.  CT abdomen shows no abnormalities except cystitis.  Hemoglobin 9.7 g.  MCV 78.2.  Random glucose 256 creatinine 0.62. Pregnancy test negative.  Lipase normal.  Very likely her symptoms and presentation are related to gastroparesis from poorly controlled type 1 diabetes and use of cannabis causing cannabinoid hyperemesis syndrome.  I have clearly explained  to her that she should stop using marijuana and she should aim to have better control of her diabetes.  Whenever the blood sugars are over 150 mg/dL it leads to decreased gastric emptying.  There is also a concern for microcytic anemia no iron studies have been done would recommend to perform iron studies and if iron deficient consider iron replacement as well as the fact that she mentions she has heavy menstrual period which may very well be the cause of her anemia.  Will leave further management to Dr. Caleen  I will sign off.  Please call me if any further GI concerns or questions.  We would like to thank you for the opportunity to participate in the care of Kirti X Hawkins-Mack.    Thank you for involving me  in the care of this patient.      LOS: 0 days   Ruel Kung, MD  03/23/2024, 12:00 PM

## 2024-03-23 NOTE — Assessment & Plan Note (Signed)
 Patient need gastric emptying studies for final diagnosis of gastroparesis, he has to be off from marijuana before that. -GI was also consulted -Should be using small frequent meals and Reglan before meal to see if that will help.

## 2024-03-23 NOTE — Assessment & Plan Note (Signed)
 Uncontrolled diabetes with hyperglycemia and elevated A1c of 9.8. - CBG remained elevated -Increasing Semglee to 8 units twice daily -Adding 3 units with meals if eat more than 50% -Continue with SSI

## 2024-03-24 DIAGNOSIS — E1065 Type 1 diabetes mellitus with hyperglycemia: Secondary | ICD-10-CM | POA: Diagnosis not present

## 2024-03-24 DIAGNOSIS — R1116 Cannabis hyperemesis syndrome: Secondary | ICD-10-CM | POA: Diagnosis not present

## 2024-03-24 DIAGNOSIS — R112 Nausea with vomiting, unspecified: Secondary | ICD-10-CM | POA: Diagnosis not present

## 2024-03-24 DIAGNOSIS — K3184 Gastroparesis: Secondary | ICD-10-CM | POA: Diagnosis not present

## 2024-03-24 LAB — GLUCOSE, CAPILLARY
Glucose-Capillary: 167 mg/dL — ABNORMAL HIGH (ref 70–99)
Glucose-Capillary: 143 mg/dL — ABNORMAL HIGH (ref 70–99)

## 2024-03-24 MED ORDER — VITAMIN B-12 1000 MCG PO TABS
1000.0000 ug | ORAL_TABLET | Freq: Every day | ORAL | Status: DC
Start: 2024-03-24 — End: 2024-03-24

## 2024-03-24 MED ORDER — VITAMIN B-12 1000 MCG PO TABS: 1000.0000 ug | ORAL_TABLET | Freq: Every day | ORAL | 1 refills | Status: AC

## 2024-03-24 MED ORDER — LACTATED RINGERS IV SOLN
INTRAVENOUS | Status: DC
Start: 2024-03-24 — End: 2024-03-24

## 2024-03-24 NOTE — Plan of Care (Signed)

## 2024-03-24 NOTE — Discharge Summary (Signed)
 Physician Discharge Summary   Patient: Rebecca Callahan MRN: [id number] DOB: 09-08-1995  Admit date:     03/22/2024  Discharge date: 03/24/24  Discharge Physician: Amaryllis Dare   PCP: Hersey Norleen ORN, MD   Recommendations at discharge:  Please obtain CBC and CMP and follow-up Please continue counseling for marijuana use as they can increase the symptoms of gastroparesis. Patient will get benefit from getting a gastric emptying studies and GI evaluation once off from marijuana. Follow-up with primary care provider Follow-up with gastroenterology  Discharge Diagnoses: Principal Problem:   Intractable nausea and vomiting Active Problems:   Cannabinoid hyperemesis syndrome   Gastroparesis   Uncontrolled type 1 diabetes mellitus with hyperglycemia, with long-term current use of insulin  (HCC)   Iron deficiency anemia   Hospital Course:  ROSSY VIRAG is a 28 y.o. female with medical history significant of type 1 diabetes, gastroparesis, marijuana use, anemia, who presents with intractable nausea, vomiting and abdominal pain.   Patient was recently hospitalized from 10/25 - 10/26 due to intractable nausea vomiting which was likely due to gastroparesis and marijuana use.  Patient had multiple hospitalizations and ED visits since July. She was discharged home on Reglan and came back to ED with worsening symptoms today.  On presentation vitals and labs stable.  UA negative for UTI.  11/2: Vital stable, CBG elevated, increasing Semglee to 8 units twice daily and adding 3 units with meal.  Pseudohyponatremia secondary to hyperglycemia.  Patient continued to use marijuana despite multiple counseling.  Mother was very concerned that we are not doing anything else except giving her nausea medications and wants to take her to [facility] or [facility]. We will not be able to transfer.  GI was also consulted to see if we can do a gastric emptying studies or endoscopy to complete the workup as  inpatient.  Marijuana will affect the gastric emptying studies.  11/3: Hemodynamically stable but continued to have significant nausea and per patient she was throwing up at night.  Not able to take much p.o.  Mother requested discharge as she wants a second opinion at a tertiary care center with the hope that they can help her more.  She was again counseled to stop using marijuana and keep her blood glucose under good control.  Patient will get benefit from gastric emptying studies once off from marijuana and possible EGD if needed by GI for further evaluation.  Patient was also recently found to have low iron saturation and ferritin, low B12 and should continue with supplements.  She has an history of menorrhagia. She will continue her current medications and need to have a close follow-up with her providers for further assistance.  Assessment and Plan: * Intractable nausea and vomiting Multiple recurrent hospitalizations for similar reason. It was thought to be due to a combination of diabetes induced gastroparesis and marijuana use.  There was also concern of related to opioid on prior admission.  There was no gastric emptying studies or further GI evaluation done yet.  Family seems frustrated but patient continue to use marijuana despite multiple counseling.  - Marijuana use can affect the gastric emptying studies and patient has to be off of marijuana before doing it. - GI was consulted for further evaluation. - Continue with supportive care with antiemetics.  Cannabinoid hyperemesis syndrome Patient continues to use marijuana, last use was either on 10/30 or 10/31 per mother. - Counseling was again provided  Gastroparesis Patient need gastric emptying studies for final diagnosis of gastroparesis,  he has to be off from marijuana before that. -GI was also consulted -Should be using small frequent meals and Reglan before meal to see if that will help.  Uncontrolled type 1 diabetes  mellitus with hyperglycemia, with long-term current use of insulin  (HCC) Uncontrolled diabetes with hyperglycemia and elevated A1c of 9.8. - CBG remained elevated -Increasing Semglee to 8 units twice daily -Adding 3 units with meals if eat more than 50% -Continue with SSI  Iron deficiency anemia - Continue home iron supplement   Consultants: Gastroenterology Procedures performed: None Disposition: Home Diet recommendation:  Discharge Diet Orders (From admission, onward)     Start     Ordered   03/24/24 0000  Diet - low sodium heart healthy        03/24/24 1431           Carb modified diet DISCHARGE MEDICATION: Allergies as of 03/24/2024       Reactions   Amoxicillin Rash, Dermatitis   Per patient, occurred when she was a baby.        Medication List     TAKE these medications    Blood Glucose Monitoring Suppl Devi 1 each by Does not apply route in the morning, at noon, and at bedtime. May substitute to any manufacturer covered by patient's insurance.   BLOOD GLUCOSE TEST STRIPS Strp 1 each by In Vitro route in the morning, at noon, and at bedtime. May substitute to any manufacturer covered by patient's insurance.   cyanocobalamin 1000 MCG tablet Commonly known as: VITAMIN B12 Take 1 tablet (1,000 mcg total) by mouth daily.   ferrous sulfate 325 (65 FE) MG EC tablet Take 1 tablet (325 mg total) by mouth daily with breakfast.   insulin  lispro 100 UNIT/ML KwikPen Commonly known as: HUMALOG  1-9 Units 3 (three) times daily. Sliding scale. Pt stated she takes by carb counts. Did not provide units when asked.   Lantus SoloStar 100 UNIT/ML Solostar Pen Generic drug: insulin  glargine Inject 12 Units into the skin at bedtime.   metoCLOPramide 10 MG tablet Commonly known as: REGLAN Take 1 tablet (10 mg total) by mouth every 8 (eight) hours as needed for nausea.   ondansetron  4 MG disintegrating tablet Commonly known as: ZOFRAN -ODT Take 1 tablet (4 mg total) by  mouth every 8 (eight) hours as needed for nausea or vomiting.   TechLite Pen Needles 32G X 4 MM Misc Generic drug: Insulin  Pen Needle use with humalog  kwikpen mix 75/25 twice daily        Follow-up Information     Hersey Norleen ORN, MD. Schedule an appointment as soon as possible for a visit in 1 week(s).   Specialty: Family Medicine Contact information: [address] [place] [address] [phone]                Discharge Exam: Fredricka Weights   03/22/24 1957 03/23/24 0627  Weight: 67.6 kg 67 kg   General.  Well-developed lady, in no acute distress. Pulmonary.  Lungs clear bilaterally, normal respiratory effort. CV.  Regular rate and rhythm, no JVD, rub or murmur. Abdomen.  Soft, nontender, nondistended, BS positive. CNS.  Alert and oriented .  No focal neurologic deficit. Extremities.  No edema, no cyanosis, pulses intact and symmetrical. Psychiatry.  Judgment and insight appears normal.   Condition at discharge: stable  The results of significant diagnostics from this hospitalization (including imaging, microbiology, ancillary and laboratory) are listed below for reference.   Imaging Studies: CT ABDOMEN  PELVIS W CONTRAST Result Date: 03/15/2024 EXAM: CT ABDOMEN AND PELVIS WITH CONTRAST 03/15/2024 07:28:57 PM TECHNIQUE: CT of the abdomen and pelvis was performed with the administration of 100 mL of iohexol  (OMNIPAQUE ) 300 MG/ML solution. Multiplanar reformatted images are provided for review. Automated exposure control, iterative reconstruction, and/or weight-based adjustment of the mA/kV was utilized to reduce the radiation dose to as low as reasonably achievable. COMPARISON: CT pelvis dated 03/05/2024 and CT abdomen/pelvis dated 12/09/2023. CLINICAL HISTORY: Abdominal pain, acute, nonlocalized. Pt to ed from home via POV for emesis since last night. Pt was DC from here on Sunday this week. Pt was placed on PO antibiotics and has been vomiting. Pt is  caox4, in no acute distress and ambulatory to triage. FINDINGS: LOWER CHEST: Small hiatal hernia. LIVER: The liver is unremarkable. GALLBLADDER AND BILE DUCTS: Gallbladder is unremarkable. No biliary ductal dilatation. SPLEEN: No acute abnormality. PANCREAS: No acute abnormality. ADRENAL GLANDS: No acute abnormality. KIDNEYS, URETERS AND BLADDER: No stones in the kidneys or ureters. No hydronephrosis. No perinephric or periureteral stranding. Mild bladder wall thickening with perivesical stranding, suggesting cystitis. GI AND BOWEL: Stomach demonstrates no acute abnormality. Normal appendix (image 48). There is no bowel obstruction. PERITONEUM AND RETROPERITONEUM: Trace pelvic free fluid. No free air. VASCULATURE: Aorta is normal in caliber. LYMPH NODES: No lymphadenopathy. REPRODUCTIVE ORGANS: No acute abnormality. BONES AND SOFT TISSUES: No acute osseous abnormality. No focal soft tissue abnormality. IMPRESSION: 1. Suspected cystitis. 2. Otherwise negative. Electronically signed by: Pinkie Pebbles MD 03/15/2024 07:48 PM EDT RP Workstation: [id number]   CT PELVIS W CONTRAST Result Date: 03/05/2024 EXAM: CT Pelvis, With IV Contrast 03/05/2024 10:12:56 AM TECHNIQUE: Axial images were acquired through the pelvis with IV contrast. iohexol  (OMNIPAQUE ) 300 MG/ML solution was administered intravenously. Reformatted images were reviewed. Automated exposure control, iterative reconstruction, and/or weight based adjustment of the mA/kV was utilized to reduce the radiation dose to as low as reasonably achievable. COMPARISON: CT of the abdomen and pelvis dated 12/09/2023. CLINICAL HISTORY: abscess, cellulitis left upper leg and pubis. Abscess abscess, cellulitis left upper leg and pubis. Abscess FINDINGS: BONES: No acute fracture or focal osseous lesion. JOINTS: No dislocation. The joint spaces are normal. SOFT TISSUES: Mild thickening of the skin present anteromedially within the proximal thigh with stranding of  the underlying subcutaneous fat. There is no appreciable fluid collection. There is no appreciable involvement of the mons pubis or of the adductor musculature of the left hip. There are several shotty inguinal lymph nodes present bilaterally, more pronounced on the left, which have increased in size since the previous study and measure up to approximately 18 mm in long axis. INTRAPELVIC CONTENTS: The pelvic viscera are unremarkable. IMPRESSION: 1. Mild thickening of the skin anteromedially within the proximal thigh with stranding of the underlying subcutaneous fat, without appreciable fluid collection. Findings are compatible with cellulitis. 2. Several shotty inguinal lymph nodes bilaterally, more pronounced on the left, which have increased in size since the previous study and measure up to approximately 18 mm in long axis. Electronically signed by: Evalene Coho MD 03/05/2024 10:44 AM EDT RP Workstation: [id number]   US  LT LOWER EXTREM LTD SOFT TISSUE NON VASCULAR Result Date: 03/05/2024 CLINICAL DATA:  Cellulitis EXAM: ULTRASOUND LEFT LOWER EXTREMITY LIMITED TECHNIQUE: Ultrasound examination of the lower extremity soft tissues was performed in the area of clinical concern. COMPARISON:  None Available. FINDINGS: Focused ultrasound exam was performed in the region of patient concern over the groin area. This reveals subcutaneous edema and a small  complex fluid collection measuring 9 x 9 x 8 mm. Color Doppler assessment shows hyperemia in the soft tissues adjacent to the collection. IMPRESSION: 9 x 9 x 8 mm complex fluid collection in the region of patient concern. Imaging features are compatible with a small abscess on a background of cellulitis. Close follow-up recommended and follow-up CT or MRI with contrast could be used to more definitively characterize as clinically warranted. Electronically Signed   By: Camellia Candle M.D.   On: 03/05/2024 09:16    Microbiology: Results for orders placed or  performed during the hospital encounter of 03/05/24  Blood culture (routine x 2)     Status: None   Collection Time: 03/05/24 12:21 PM   Specimen: BLOOD  Result Value Ref Range Status   Specimen Description BLOOD LEFT ANTECUBITAL  Final   Special Requests   Final    BOTTLES DRAWN AEROBIC AND ANAEROBIC Blood Culture results may not be optimal due to an inadequate volume of blood received in culture bottles   Culture   Final    NO GROWTH 5 DAYS Performed at [facility], [address] [place], [place] [address]    Report Status 03/10/2024 FINAL  Final  Blood culture (routine x 2)     Status: None   Collection Time: 03/05/24 12:21 PM   Specimen: BLOOD  Result Value Ref Range Status   Specimen Description BLOOD RIGHT ANTECUBITAL  Final   Special Requests   Final    BOTTLES DRAWN AEROBIC AND ANAEROBIC Blood Culture results may not be optimal due to an inadequate volume of blood received in culture bottles   Culture   Final    NO GROWTH 5 DAYS Performed at [facility], [address], [place], [place] [address]    Report Status 03/10/2024 FINAL  Final  Aerobic Culture w Gram Stain (superficial specimen)     Status: None   Collection Time: 03/07/24  9:27 AM   Specimen: Thigh; Abscess  Result Value Ref Range Status   Specimen Description   Final    ABSCESS Performed at [facility], [address], [place], [place] [address]    Special Requests   Final     LEFT THIGH Performed at [facility], [address] [place], [place] [address]    Gram Stain   Final    FEW WBC PRESENT, PREDOMINANTLY PMN RARE GRAM POSITIVE COCCI RARE GRAM NEGATIVE RODS    Culture   Final    NO GROWTH 2 DAYS Performed at [facility], [address] [place], [place] [address]    Report Status 03/09/2024 FINAL  Final  MRSA Next Gen by PCR, Nasal     Status: None   Collection Time: 03/07/24  7:24 PM   Specimen: Nasal Mucosa; Nasal Swab  Result Value Ref  Range Status   MRSA by PCR Next Gen NOT DETECTED NOT DETECTED Final    Comment: (NOTE) The GeneXpert MRSA Assay (FDA approved for NASAL specimens only), is one component of a comprehensive MRSA colonization surveillance program. It is not intended to diagnose MRSA infection nor to guide or monitor treatment for MRSA infections. Test performance is not FDA approved in patients less than 2 years old. Performed at [facility], [address] [place], [place] [address]     Labs: CBC: Recent Labs  Lab 03/21/24 0401 03/22/24 1443 03/23/24 0512  WBC 3.8* 6.0 7.0  HGB 9.2* 9.6* 9.7*  HCT 31.8* 33.2* 32.3*  MCV 79.5* 79.2* 78.2*  PLT 383 396 389   Basic Metabolic Panel: Recent Labs  Lab 03/21/24 0401 03/22/24 1443 03/23/24 0512  NA 137 137 133*  K 4.0 3.6 3.7  CL 105 102 100  CO2 24 26 22   GLUCOSE 349* 218* 256*  BUN 15 12 10   CREATININE 0.61 0.49 0.62  CALCIUM 8.6* 9.0 8.7*  MG  --  2.1  --    Liver Function Tests: Recent Labs  Lab 03/22/24 1443  AST 20  ALT 11  ALKPHOS 51  BILITOT 1.6*  PROT 7.6  ALBUMIN 4.1   CBG: Recent Labs  Lab 03/23/24 1201 03/23/24 1636 03/23/24 2127 03/24/24 0807 03/24/24 1150  GLUCAP 219* 115* 148* 167* 143*    Discharge time spent: greater than 30 minutes.  This record has been created using Conservation officer, historic buildings. Errors have been sought and corrected,but may not always be located. Such creation errors do not reflect on the standard of care.   Signed: Amaryllis Dare, MD [facility] 03/24/2024
